# Patient Record
Sex: Male | Born: 1970 | Race: White | Hispanic: No | Marital: Married | Smoking: Former smoker
Health system: Southern US, Community
[De-identification: ages and names within clinical notes are randomized; demographics above are authoritative.]

## PROBLEM LIST (undated history)

## (undated) DIAGNOSIS — R0602 Shortness of breath: Secondary | ICD-10-CM

## (undated) DIAGNOSIS — R7303 Prediabetes: Secondary | ICD-10-CM

## (undated) HISTORY — DX: Prediabetes: R73.03

## (undated) HISTORY — DX: Shortness of breath: R06.02

---

## 2000-04-28 ENCOUNTER — Encounter: Payer: Self-pay | Admitting: Family Medicine

## 2002-05-27 ENCOUNTER — Encounter: Payer: Self-pay | Admitting: Family Medicine

## 2002-05-27 ENCOUNTER — Encounter: Admission: RE | Admit: 2002-05-27 | Discharge: 2002-05-27 | Payer: Self-pay | Admitting: Family Medicine

## 2007-11-26 ENCOUNTER — Ambulatory Visit: Payer: Self-pay | Admitting: Family Medicine

## 2007-12-08 LAB — CONVERTED CEMR LAB
ALT: 37 units/L (ref 0–53)
AST: 22 units/L (ref 0–37)
Albumin: 3.8 g/dL (ref 3.5–5.2)
Alkaline Phosphatase: 64 units/L (ref 39–117)
BUN: 10 mg/dL (ref 6–23)
Bilirubin, Direct: 0.1 mg/dL (ref 0.0–0.3)
CO2: 28 meq/L (ref 19–32)
Calcium: 9.4 mg/dL (ref 8.4–10.5)
Chloride: 107 meq/L (ref 96–112)
Cholesterol: 171 mg/dL (ref 0–200)
Creatinine, Ser: 0.9 mg/dL (ref 0.4–1.5)
GFR calc Af Amer: 122 mL/min
GFR calc non Af Amer: 101 mL/min
Glucose, Bld: 101 mg/dL — ABNORMAL HIGH (ref 70–99)
HDL: 29.4 mg/dL — ABNORMAL LOW (ref >39.0)
LDL Cholesterol: 120 mg/dL — ABNORMAL HIGH (ref 0–99)
Potassium: 4.1 meq/L (ref 3.5–5.1)
Sodium: 141 meq/L (ref 135–145)
Total Bilirubin: 0.9 mg/dL (ref 0.3–1.2)
Total CHOL/HDL Ratio: 5.8
Total Protein: 7.4 g/dL (ref 6.0–8.3)
Triglycerides: 109 mg/dL (ref 0–149)
VLDL: 22 mg/dL (ref 0–40)

## 2008-05-10 ENCOUNTER — Telehealth (INDEPENDENT_AMBULATORY_CARE_PROVIDER_SITE_OTHER): Payer: Self-pay | Admitting: *Deleted

## 2008-05-12 ENCOUNTER — Encounter (INDEPENDENT_AMBULATORY_CARE_PROVIDER_SITE_OTHER): Payer: Self-pay | Admitting: *Deleted

## 2008-11-15 ENCOUNTER — Ambulatory Visit (HOSPITAL_BASED_OUTPATIENT_CLINIC_OR_DEPARTMENT_OTHER): Admission: RE | Admit: 2008-11-15 | Discharge: 2008-11-15 | Payer: Self-pay | Admitting: Family Medicine

## 2008-11-15 ENCOUNTER — Ambulatory Visit: Payer: Self-pay | Admitting: Radiology

## 2009-11-24 ENCOUNTER — Ambulatory Visit: Payer: Self-pay | Admitting: Family Medicine

## 2009-11-24 DIAGNOSIS — R5383 Other fatigue: Secondary | ICD-10-CM

## 2009-11-24 DIAGNOSIS — F17200 Nicotine dependence, unspecified, uncomplicated: Secondary | ICD-10-CM | POA: Insufficient documentation

## 2009-11-24 DIAGNOSIS — R5381 Other malaise: Secondary | ICD-10-CM | POA: Insufficient documentation

## 2009-11-24 DIAGNOSIS — Z720 Tobacco use: Secondary | ICD-10-CM | POA: Insufficient documentation

## 2009-11-29 LAB — CONVERTED CEMR LAB
ALT: 29 units/L (ref 0–53)
AST: 19 units/L (ref 0–37)
Albumin: 4.2 g/dL (ref 3.5–5.2)
Alkaline Phosphatase: 74 units/L (ref 39–117)
BUN: 10 mg/dL (ref 6–23)
Basophils Absolute: 0.1 10*3/uL (ref 0.0–0.1)
Basophils Relative: 0.9 % (ref 0.0–3.0)
Bilirubin, Direct: 0.1 mg/dL (ref 0.0–0.3)
CO2: 27 meq/L (ref 19–32)
Calcium: 9.6 mg/dL (ref 8.4–10.5)
Chloride: 102 meq/L (ref 96–112)
Cholesterol: 191 mg/dL (ref 0–200)
Creatinine, Ser: 0.8 mg/dL (ref 0.4–1.5)
Eosinophils Absolute: 0.2 10*3/uL (ref 0.0–0.7)
Eosinophils Relative: 2.7 % (ref 0.0–5.0)
GFR calc non Af Amer: 114.22 mL/min (ref >60)
Glucose, Bld: 91 mg/dL (ref 70–99)
HCT: 51.1 % (ref 39.0–52.0)
HDL: 26 mg/dL — ABNORMAL LOW (ref >39.00)
Hemoglobin: 17.1 g/dL — ABNORMAL HIGH (ref 13.0–17.0)
LDL Cholesterol: 131 mg/dL — ABNORMAL HIGH (ref 0–99)
Lymphocytes Relative: 28.9 % (ref 12.0–46.0)
Lymphs Abs: 2.2 10*3/uL (ref 0.7–4.0)
MCHC: 33.5 g/dL (ref 30.0–36.0)
MCV: 95.2 fL (ref 78.0–100.0)
Monocytes Absolute: 0.5 10*3/uL (ref 0.1–1.0)
Monocytes Relative: 6.4 % (ref 3.0–12.0)
Neutro Abs: 4.6 10*3/uL (ref 1.4–7.7)
Neutrophils Relative %: 61.1 % (ref 43.0–77.0)
Platelets: 265 10*3/uL (ref 150.0–400.0)
Potassium: 3.9 meq/L (ref 3.5–5.1)
RBC: 5.37 M/uL (ref 4.22–5.81)
RDW: 12.8 % (ref 11.5–14.6)
Sodium: 137 meq/L (ref 135–145)
TSH: 1.55 microintl units/mL (ref 0.35–5.50)
Total Bilirubin: 1 mg/dL (ref 0.3–1.2)
Total CHOL/HDL Ratio: 7
Total Protein: 7.7 g/dL (ref 6.0–8.3)
Triglycerides: 172 mg/dL — ABNORMAL HIGH (ref 0.0–149.0)
VLDL: 34.4 mg/dL (ref 0.0–40.0)
WBC: 7.6 10*3/uL (ref 4.5–10.5)

## 2015-04-29 ENCOUNTER — Ambulatory Visit (INDEPENDENT_AMBULATORY_CARE_PROVIDER_SITE_OTHER): Payer: BLUE CROSS/BLUE SHIELD | Admitting: Internal Medicine

## 2015-04-29 ENCOUNTER — Ambulatory Visit (INDEPENDENT_AMBULATORY_CARE_PROVIDER_SITE_OTHER): Payer: BLUE CROSS/BLUE SHIELD

## 2015-04-29 VITALS — BP 100/68 | HR 103 | Temp 99.5°F | Resp 16 | Ht 70.0 in | Wt 265.0 lb

## 2015-04-29 DIAGNOSIS — R05 Cough: Secondary | ICD-10-CM | POA: Diagnosis not present

## 2015-04-29 DIAGNOSIS — R0602 Shortness of breath: Secondary | ICD-10-CM

## 2015-04-29 DIAGNOSIS — R509 Fever, unspecified: Secondary | ICD-10-CM

## 2015-04-29 DIAGNOSIS — T790XXA Air embolism (traumatic), initial encounter: Secondary | ICD-10-CM

## 2015-04-29 DIAGNOSIS — R059 Cough, unspecified: Secondary | ICD-10-CM

## 2015-04-29 DIAGNOSIS — J157 Pneumonia due to Mycoplasma pneumoniae: Secondary | ICD-10-CM

## 2015-04-29 LAB — POCT CBC
Granulocyte percent: 90.2 %G — AB (ref 37–80)
HCT, POC: 51.7 % (ref 43.5–53.7)
Hemoglobin: 16.4 g/dL (ref 14.1–18.1)
Lymph, poc: 2.2 (ref 0.6–3.4)
MCH, POC: 29.1 pg (ref 27–31.2)
MCHC: 31.8 g/dL (ref 31.8–35.4)
MCV: 91.5 fL (ref 80–97)
MID (cbc): 0.4 (ref 0–0.9)
MPV: 6.4 fL (ref 0–99.8)
POC Granulocyte: 24 — AB (ref 2–6.9)
POC LYMPH PERCENT: 8.4 %L — AB (ref 10–50)
POC MID %: 1.4 %M (ref 0–12)
Platelet Count, POC: 242 10*3/uL (ref 142–424)
RBC: 5.65 M/uL (ref 4.69–6.13)
RDW, POC: 15 %
WBC: 26.6 10*3/uL — AB (ref 4.6–10.2)

## 2015-04-29 LAB — POCT INFLUENZA A/B
Influenza A, POC: NEGATIVE
Influenza B, POC: NEGATIVE

## 2015-04-29 MED ORDER — CEFTRIAXONE SODIUM 1 G IJ SOLR
1.0000 g | Freq: Once | INTRAMUSCULAR | Status: AC
  Administered 2015-04-29: 1 g via INTRAMUSCULAR

## 2015-04-29 MED ORDER — HYDROCOD POLST-CPM POLST ER 10-8 MG/5ML PO SUER: 5.0000 mL | Freq: Two times a day (BID) | ORAL | Status: DC

## 2015-04-29 MED ORDER — AZITHROMYCIN 500 MG PO TABS: 500.0000 mg | ORAL_TABLET | Freq: Every day | ORAL | Status: DC

## 2015-04-29 NOTE — Progress Notes (Signed)
   Subjective:    Patient ID: Ricardo Clark, male    DOB: 02/02/1971, 44 y.o.   MRN: [id number]  HPI  I, [place] SImpson, CMA am scribing for Dr. Perrin MalteseGuest. Very abrupt onset of fever, head congestio, chest congestion, sweats, painful cough. Usually healthy otherwise. Quit smoking last year. Pt has been having body aches with chest pressure. No pain when breathing. Painful cough. Some nasal congestion. Pt is not currently taking any medications. Per pt his son was sick last week.   Review of Systems     Objective:   Physical Exam  Constitutional: He is oriented to person, place, and time. He appears well-nourished. He appears distressed.  HENT:  Head: Normocephalic.  Right Ear: External ear normal.  Left Ear: External ear normal.  Nose: Mucosal edema, rhinorrhea and sinus tenderness present. Epistaxis is observed. Right sinus exhibits no maxillary sinus tenderness and no frontal sinus tenderness. Left sinus exhibits no maxillary sinus tenderness and no frontal sinus tenderness.  Mouth/Throat: Oropharynx is clear and moist.  Eyes: Conjunctivae and EOM are normal. Pupils are equal, round, and reactive to light.  Neck: Normal range of motion. Neck supple.  Cardiovascular: Normal rate, regular rhythm and normal heart sounds.   Pulmonary/Chest: Effort normal. No tachypnea. No respiratory distress. He has decreased breath sounds. He has no wheezes. He has rhonchi. He has no rales. He exhibits tenderness.  Musculoskeletal: He exhibits tenderness.  Lymphadenopathy:    He has cervical adenopathy.  Neurological: He is alert and oriented to person, place, and time. He exhibits normal muscle tone. Coordination normal.  Skin: He is diaphoretic.  Psychiatric: He has a normal mood and affect. His behavior is normal.  Vitals reviewed.  Results for orders placed or performed in visit on 04/29/15  POCT CBC  Result Value Ref Range   WBC 26.6 (A) 4.6 - 10.2 K/uL   Lymph, poc 2.2 0.6 - 3.4   POC LYMPH  PERCENT 8.4 (A) 10 - 50 %L   MID (cbc) 0.4 0 - 0.9   POC MID % 1.4 0 - 12 %M   POC Granulocyte 24.0 (A) 2 - 6.9   Granulocyte percent 90.2 (A) 37 - 80 %G   RBC 5.65 4.69 - 6.13 M/uL   Hemoglobin 16.4 14.1 - 18.1 g/dL   HCT, POC 40.951.7 81.143.5 - 53.7 %   MCV 91.5 80 - 97 fL   MCH, POC 29.1 27 - 31.2 pg   MCHC 31.8 31.8 - 35.4 g/dL   RDW, POC 91.415.0 %   Platelet Count, POC 242 142 - 424 K/uL   MPV 6.4 0 - 99.8 fL  POCT Influenza A/B  Result Value Ref Range   Influenza A, POC Negative    Influenza B, POC Negative    [facility] reading (PRIMARY) by  DGuest early posterior infiltrate         Assessment & Plan:  Probable early Pneumonia Rocephin 1 g now Zithromax 500mg /Tussionex RTC tomorrow recheck

## 2015-04-29 NOTE — Patient Instructions (Addendum)
Cough, Adult  A cough is a reflex that helps clear your throat and airways. It can help heal the body or may be a reaction to an irritated airway. A cough may only last 2 or 3 weeks (acute) or may last more than 8 weeks (chronic).  CAUSES Acute cough:  Viral or bacterial infections. Chronic cough:  Infections.  Allergies.  Asthma.  Post-nasal drip.  Smoking.  Heartburn or acid reflux.  Some medicines.  Chronic lung problems (COPD).  Cancer. SYMPTOMS   Cough.  Fever.  Chest pain.  Increased breathing rate.  High-pitched whistling sound when breathing (wheezing).  Colored mucus that you cough up (sputum). TREATMENT   A bacterial cough may be treated with antibiotic medicine.  A viral cough must run its course and will not respond to antibiotics.  Your caregiver may recommend other treatments if you have a chronic cough. HOME CARE INSTRUCTIONS   Only take over-the-counter or prescription medicines for pain, discomfort, or fever as directed by your caregiver. Use cough suppressants only as directed by your caregiver.  Use a cold steam vaporizer or humidifier in your bedroom or home to help loosen secretions.  Sleep in a semi-upright position if your cough is worse at night.  Rest as needed.  Stop smoking if you smoke. SEEK IMMEDIATE MEDICAL CARE IF:   You have pus in your sputum.  Your cough starts to worsen.  You cannot control your cough with suppressants and are losing sleep.  You begin coughing up blood.  You have difficulty breathing.  You develop pain which is getting worse or is uncontrolled with medicine.  You have a fever. MAKE SURE YOU:   Understand these instructions.  Will watch your condition.  Will get help right away if you are not doing well or get worse. Document Released: 05/24/2011 Document Revised: 02/17/2012 Document Reviewed: 05/24/2011 [facility] Patient Information 2015 [place], [place]. This information is not intended  to replace advice given to you by your health care provider. Make sure you discuss any questions you have with your health care provider. Fever, Adult A fever is a higher than normal body temperature. In an adult, an oral temperature around 98.6 F (37 C) is considered normal. A temperature of 100.4 F (38 C) or higher is generally considered a fever. Mild or moderate fevers generally have no long-term effects and often do not require treatment. Extreme fever (greater than or equal to 106 F or 41.1 C) can cause seizures. The sweating that may occur with repeated or prolonged fever may cause dehydration. Elderly people can develop confusion during a fever. A measured temperature can vary with:  Age.  Time of day.  Method of measurement (mouth, underarm, rectal, or ear). The fever is confirmed by taking a temperature with a thermometer. Temperatures can be taken different ways. Some methods are accurate and some are not.  An oral temperature is used most commonly. Electronic thermometers are fast and accurate.  An ear temperature will only be accurate if the thermometer is positioned as recommended by the manufacturer.  A rectal temperature is accurate and done for those adults who have a condition where an oral temperature cannot be taken.  An underarm (axillary) temperature is not accurate and not recommended. Fever is a symptom, not a disease.  CAUSES   Infections commonly cause fever.  Some noninfectious causes for fever include:  Some arthritis conditions.  Some thyroid or adrenal gland conditions.  Some immune system conditions.  Some types of cancer.  A medicine reaction.  High doses of certain street drugs such as methamphetamine.  Dehydration.  Exposure to high outside or room temperatures.  Occasionally, the source of a fever cannot be determined. This is sometimes called a "fever of unknown origin" (FUO).  Some situations may lead to a temporary rise in body  temperature that may go away on its own. Examples are:  Childbirth.  Surgery.  Intense exercise. HOME CARE INSTRUCTIONS   Take appropriate medicines for fever. Follow dosing instructions carefully. If you use acetaminophen to reduce the fever, be careful to avoid taking other medicines that also contain acetaminophen. Do not take aspirin for a fever if you are younger than age 619. There is an association with Reye's syndrome. Reye's syndrome is a rare but potentially deadly disease.  If an infection is present and antibiotics have been prescribed, take them as directed. Finish them even if you start to feel better.  Rest as needed.  Maintain an adequate fluid intake. To prevent dehydration during an illness with prolonged or recurrent fever, you may need to drink extra fluid.Drink enough fluids to keep your urine clear or pale yellow.  Sponging or bathing with room temperature water may help reduce body temperature. Do not use ice water or alcohol sponge baths.  Dress comfortably, but do not over-bundle. SEEK MEDICAL CARE IF:   You are unable to keep fluids down.  You develop vomiting or diarrhea.  You are not feeling at least partly better after 3 days.  You develop new symptoms or problems. SEEK IMMEDIATE MEDICAL CARE IF:   You have shortness of breath or trouble breathing.  You develop excessive weakness.  You are dizzy or you faint.  You are extremely thirsty or you are making little or no urine.  You develop new pain that was not there before (such as in the head, neck, chest, back, or abdomen).  You have persistent vomiting and diarrhea for more than 1 to 2 days.  You develop a stiff neck or your eyes become sensitive to light.  You develop a skin rash.  You have a fever or persistent symptoms for more than 2 to 3 days.  You have a fever and your symptoms suddenly get worse. MAKE SURE YOU:   Understand these instructions.  Will watch your  condition.  Will get help right away if you are not doing well or get worse. Document Released: 05/21/2001 Document Revised: 04/11/2014 Document Reviewed: 09/26/2011 [facility] Patient Information 2015 [place], [place]. This information is not intended to replace advice given to you by your health care provider. Make sure you discuss any questions you have with your health care provider. Pneumonia Pneumonia is an infection of the lungs.  CAUSES Pneumonia may be caused by bacteria or a virus. Usually, these infections are caused by breathing infectious particles into the lungs (respiratory tract). SIGNS AND SYMPTOMS   Cough.  Fever.  Chest pain.  Increased rate of breathing.  Wheezing.  Mucus production. DIAGNOSIS  If you have the common symptoms of pneumonia, your health care provider will typically confirm the diagnosis with a chest X-ray. The X-ray will show an abnormality in the lung (pulmonary infiltrate) if you have pneumonia. Other tests of your blood, urine, or sputum may be done to find the specific cause of your pneumonia. Your health care provider may also do tests (blood gases or pulse oximetry) to see how well your lungs are working. TREATMENT  Some forms of pneumonia may be spread to other people  when you cough or sneeze. You may be asked to wear a mask before and during your exam. Pneumonia that is caused by bacteria is treated with antibiotic medicine. Pneumonia that is caused by the influenza virus may be treated with an antiviral medicine. Most other viral infections must run their course. These infections will not respond to antibiotics.  HOME CARE INSTRUCTIONS   Cough suppressants may be used if you are losing too much rest. However, coughing protects you by clearing your lungs. You should avoid using cough suppressants if you can.  Your health care provider may have prescribed medicine if he or she thinks your pneumonia is caused by bacteria or influenza. Finish your  medicine even if you start to feel better.  Your health care provider may also prescribe an expectorant. This loosens the mucus to be coughed up.  Take medicines only as directed by your health care provider.  Do not smoke. Smoking is a common cause of bronchitis and can contribute to pneumonia. If you are a smoker and continue to smoke, your cough may last several weeks after your pneumonia has cleared.  A cold steam vaporizer or humidifier in your room or home may help loosen mucus.  Coughing is often worse at night. Sleeping in a semi-upright position in a recliner or using a couple pillows under your head will help with this.  Get rest as you feel it is needed. Your body will usually let you know when you need to rest. PREVENTION A pneumococcal shot (vaccine) is available to prevent a common bacterial cause of pneumonia. This is usually suggested for:  People over 67 years old.  Patients on chemotherapy.  People with chronic lung problems, such as bronchitis or emphysema.  People with immune system problems. If you are over 65 or have a high risk condition, you may receive the pneumococcal vaccine if you have not received it before. In some countries, a routine influenza vaccine is also recommended. This vaccine can help prevent some cases of pneumonia.You may be offered the influenza vaccine as part of your care. If you smoke, it is time to quit. You may receive instructions on how to stop smoking. Your health care provider can provide medicines and counseling to help you quit. SEEK MEDICAL CARE IF: You have a fever. SEEK IMMEDIATE MEDICAL CARE IF:   Your illness becomes worse. This is especially true if you are elderly or weakened from any other disease.  You cannot control your cough with suppressants and are losing sleep.  You begin coughing up blood.  You develop pain which is getting worse or is uncontrolled with medicines.  Any of the symptoms which initially brought  you in for treatment are getting worse rather than better.  You develop shortness of breath or chest pain. MAKE SURE YOU:   Understand these instructions.  Will watch your condition.  Will get help right away if you are not doing well or get worse. Document Released: 11/25/2005 Document Revised: 04/11/2014 Document Reviewed: 02/14/2011 [facility] Patient Information 2015 [place], [place]. This information is not intended to replace advice given to you by your health care provider. Make sure you discuss any questions you have with your health care provider.

## 2015-04-30 ENCOUNTER — Ambulatory Visit (INDEPENDENT_AMBULATORY_CARE_PROVIDER_SITE_OTHER): Payer: BLUE CROSS/BLUE SHIELD

## 2015-04-30 ENCOUNTER — Ambulatory Visit (INDEPENDENT_AMBULATORY_CARE_PROVIDER_SITE_OTHER): Payer: BLUE CROSS/BLUE SHIELD | Admitting: Internal Medicine

## 2015-04-30 ENCOUNTER — Encounter: Payer: Self-pay | Admitting: *Deleted

## 2015-04-30 VITALS — BP 100/74 | HR 78 | Temp 98.9°F | Resp 16 | Ht 70.0 in | Wt 265.0 lb

## 2015-04-30 DIAGNOSIS — J189 Pneumonia, unspecified organism: Secondary | ICD-10-CM | POA: Diagnosis not present

## 2015-04-30 DIAGNOSIS — J0140 Acute pansinusitis, unspecified: Secondary | ICD-10-CM | POA: Diagnosis not present

## 2015-04-30 DIAGNOSIS — J0111 Acute recurrent frontal sinusitis: Secondary | ICD-10-CM

## 2015-04-30 LAB — POCT CBC
GRANULOCYTE PERCENT: 85.3 % — AB (ref 37–80)
HCT, POC: 51.4 % (ref 43.5–53.7)
HEMOGLOBIN: 16.9 g/dL (ref 14.1–18.1)
Lymph, poc: 2.7 (ref 0.6–3.4)
MCH, POC: 29.7 pg (ref 27–31.2)
MCHC: 32.9 g/dL (ref 31.8–35.4)
MCV: 90.4 fL (ref 80–97)
MID (CBC): 0.6 (ref 0–0.9)
MPV: 6.5 fL (ref 0–99.8)
POC GRANULOCYTE: 18.8 — AB (ref 2–6.9)
POC LYMPH PERCENT: 12.2 %L (ref 10–50)
POC MID %: 2.5 % (ref 0–12)
Platelet Count, POC: 244 10*3/uL (ref 142–424)
RBC: 5.68 M/uL (ref 4.69–6.13)
RDW, POC: 13.9 %
WBC: 22 10*3/uL — AB (ref 4.6–10.2)

## 2015-04-30 MED ORDER — CEFTRIAXONE SODIUM 1 G IJ SOLR
1.0000 g | Freq: Once | INTRAMUSCULAR | Status: AC
  Administered 2015-04-30: 1 g via INTRAMUSCULAR

## 2015-04-30 MED ORDER — FLUTICASONE PROPIONATE 50 MCG/ACT NA SUSP: 2.0000 | Freq: Every day | NASAL | Status: DC

## 2015-04-30 MED ORDER — METHYLPREDNISOLONE ACETATE 80 MG/ML IJ SUSP
120.0000 mg | Freq: Once | INTRAMUSCULAR | Status: AC
  Administered 2015-04-30: 120 mg via INTRAMUSCULAR

## 2015-04-30 NOTE — Progress Notes (Signed)
   Subjective:    Patient ID: Ricardo Clark, male    DOB: 02/17/1971, 44 y.o.   MRN: [id number]  HPI Has improved a little. WBC 26,000 yesterday, Fever gone. Cough is less, chest aches anterior left. CXR bronchitic changes. His sinuses are painful and full, his nose obstructed. Afrin was minimal help last night. Has chronic nasosinusitis hx, never seen ENT  Review of Systems     Objective:   Physical Exam  Constitutional: He is oriented to person, place, and time. He appears well-nourished. No distress.  HENT:  Head: Normocephalic.  Nose: Mucosal edema, rhinorrhea, sinus tenderness and nasal deformity present. No epistaxis. Right sinus exhibits maxillary sinus tenderness and frontal sinus tenderness. Left sinus exhibits maxillary sinus tenderness and frontal sinus tenderness.  Eyes: EOM are normal. No scleral icterus.  Neck: Normal range of motion.  Pulmonary/Chest: Effort normal. No tachypnea. No respiratory distress. He has decreased breath sounds. He has no wheezes. He has rhonchi. He has no rales. He exhibits tenderness.  Musculoskeletal: Normal range of motion.  Neurological: He is alert and oriented to person, place, and time. He exhibits normal muscle tone. Coordination normal.  Skin: Skin is warm and dry. No rash noted.  Psychiatric: He has a normal mood and affect.   Results for orders placed or performed in visit on 04/30/15  POCT CBC  Result Value Ref Range   WBC 22.0 (A) 4.6 - 10.2 K/uL   Lymph, poc 2.7 0.6 - 3.4   POC LYMPH PERCENT 12.2 10 - 50 %L   MID (cbc) 0.6 0 - 0.9   POC MID % 2.5 0 - 12 %M   POC Granulocyte 18.8 (A) 2 - 6.9   Granulocyte percent 85.3 (A) 37 - 80 %G   RBC 5.68 4.69 - 6.13 M/uL   Hemoglobin 16.9 14.1 - 18.1 g/dL   HCT, POC 62.151.4 30.843.5 - 53.7 %   MCV 90.4 80 - 97 fL   MCH, POC 29.7 27 - 31.2 pg   MCHC 32.9 31.8 - 35.4 g/dL   RDW, POC 65.713.9 %   Platelet Count, POC 244 142 - 424 K/uL   MPV 6.5 0 - 99.8 fL      [facility] Policy for Prescribing  Controlled Substances (Revised 10/2012) 1. Prescriptions for controlled substances will be filled by ONE provider at [facility] with whom you have established and developed a plan for your care, including follow-up. 2. You are encouraged to schedule an appointment with your prescriber at our appointment center for follow-up visits whenever possible. 3. If you request a prescription for the controlled substance while at [facility] for an acute problem (with someone other than your regular prescriber), you MAY be given a ONE-TIME prescription for a 30-day supply of the controlled substance, to allow time for you to return to see your regular prescriber for additional prescriptions [facility] reading (PRIMARY) by  Dr.Acelin Ferdig very swollen nasal turbinates, possible polyps.      Assessment & Plan:  CT scan sinuses next week Pneumonia/sinusitis/leukocytosis Rocephin 1g Depomedrol 120mg /Fluticasone nasal qd Finish zithromax/RTC Tuesday am

## 2015-04-30 NOTE — Patient Instructions (Addendum)

## 2015-05-01 ENCOUNTER — Telehealth: Payer: Self-pay

## 2015-05-01 NOTE — Telephone Encounter (Signed)
[facility] & 04/30/2015 for CAP & Sinusitis.

## 2015-05-01 NOTE — Telephone Encounter (Signed)
PLEASE NOTE: All timestamps contained within this report are represented as [place] Standard Time. CONFIDENTIALTY NOTICE: This fax transmission is intended only for the addressee. It contains information that is legally privileged, confidential or otherwise protected from use or disclosure. If you are not the intended recipient, you are strictly prohibited from reviewing, disclosing, copying using or disseminating any of this information or taking any action in reliance on or regarding this information. If you have received this fax in error, please notify us immediately by telephone so that we can arrange for its return to us. Phone: [phone], Toll-Free: [phone], Fax: [phone] Page: 1 of 2 Call Id: [id number] [facility] - Client TELEPHONE ADVICE RECORD [facility] Patient Name: Ricardo MangesDAIR CARDAJAL Gender: Male DOB: 01/13/1971 Age: [age] Y 8 M 15 D Return Phone Number: [phone] (Primary) Address: City/State/Zip: [place] Client [facility] Physician Ermalene SearingBedsole, Virg[place] Contact Type Call Call Type Triage / Clinical Relationship To Patient Self Return Phone Number [phone] (Primary) Chief Complaint BREATHING - shortness of breath or sounds breathless Initial Comment Caller states he has shortness of breath and a cough/fever PreDisposition Call Doctor Nurse Assessment Nurse: Ricardo BenesJohnson, Ricardo Clark, Ricardo Clark Date/Time Ricardo Clark(Eastern Time): 04/29/2015 10:22:43 AM Confirm and document reason for call. If symptomatic, describe symptoms. ---Susa SimmondsAdair complaining of shortness of breath, fever and cough. productive cough with red-brown mucus. onset last night also temp is 102. orally Has the patient traveled out of the country within the last 30 days? ---No Does the patient require triage? ---Yes Related visit to physician within the last 2 weeks? ---No Does the PT have any  chronic conditions? (i.e. diabetes, asthma, etc.) ---No Guidelines Guideline Title Affirmed Question Affirmed Notes Nurse Date/Time Ricardo Clark(Eastern Time) Cough - Acute Productive Wheezing is present Ricardo HandyJohnson, Ricardo Clark, Ricardo Clark 04/29/2015 10:24:34 AM Disp. Time Ricardo Clark(Eastern Time) Disposition Final User 04/29/2015 10:20:23 AM Send to Urgent Queue Ricardo Clark, Ricardo Clark 04/29/2015 10:38:56 AM See Physician within 4 Hours (or PCP triage) Yes Ricardo BenesJohnson, Ricardo Clark, Suzi RootsGail Caller Understands: Yes Disagree/Comply: Comply Care Advice Given Per Guideline SEE PHYSICIAN WITHIN 4 HOURS (or PCP triage): * You become worse. PLEASE NOTE: All timestamps contained within this report are represented as [place] Standard Time. CONFIDENTIALTY NOTICE: This fax transmission is intended only for the addressee. It contains information that is legally privileged, confidential or otherwise protected from use or disclosure. If you are not the intended recipient, you are strictly prohibited from reviewing, disclosing, copying using or disseminating any of this information or taking any action in reliance on or regarding this information. If you have received this fax in error, please notify us immediately by telephone so that we can arrange for its return to us. Phone: [phone], Toll-Free: [phone], Fax: [phone] Page: 2 of 2 Call Id: [id number] After Care Instructions Given Call Event Type User Date / Time Description Referrals  [facility]

## 2015-05-01 NOTE — Telephone Encounter (Signed)
Pt was seen 04/29/15 and 04/30/15 [facility] Dr [facility].

## 2015-05-01 NOTE — Telephone Encounter (Signed)
Agree pt needs to be seen today..  I cannot tell if appt was made.

## 2016-02-04 IMAGING — CR DG SINUSES COMPLETE 3+V
5 series · 5 of 5 positions shown · non-contrast
Comparison: None.

CLINICAL DATA: Facial pain in the region of the paranasal sinuses.

EXAM:
PARANASAL SINUSES - COMPLETE 3 + VIEW

[waters]
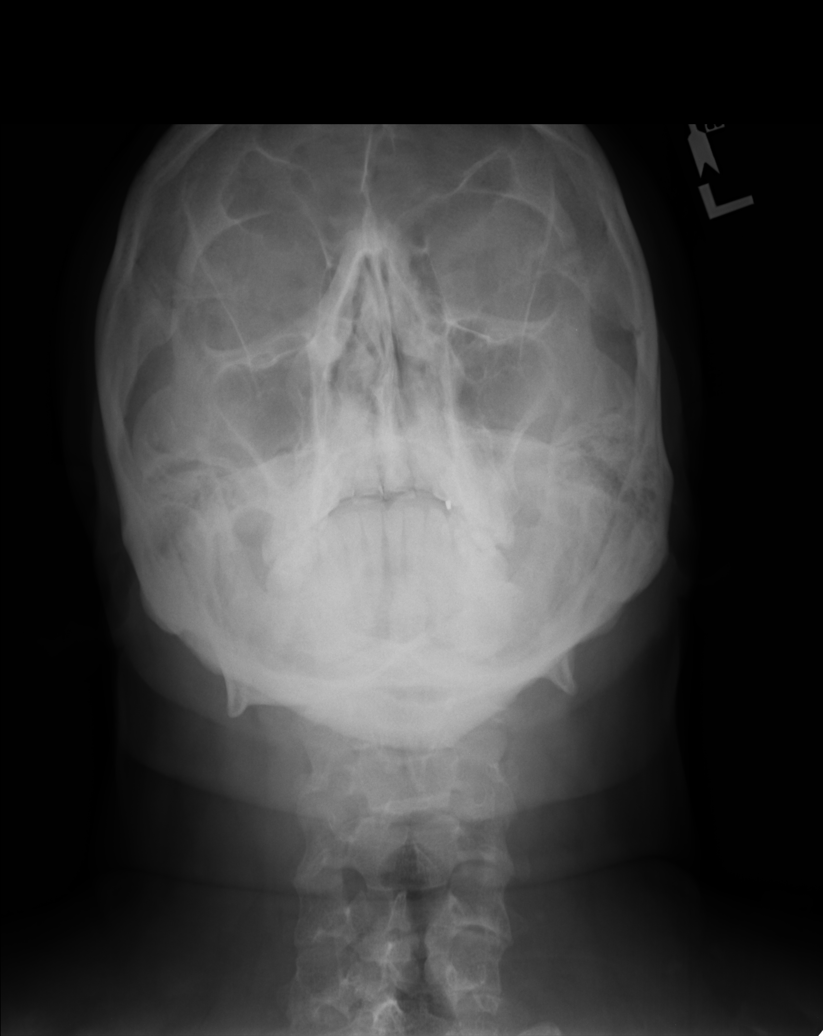

[PA]
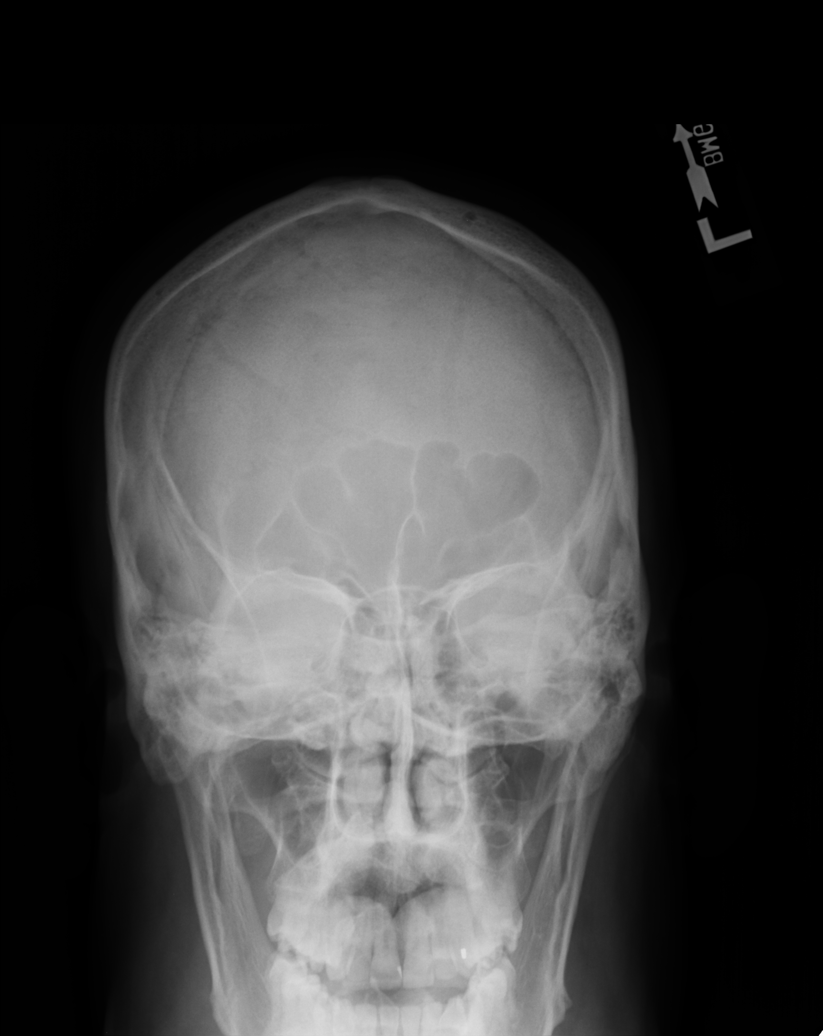

[lateral]
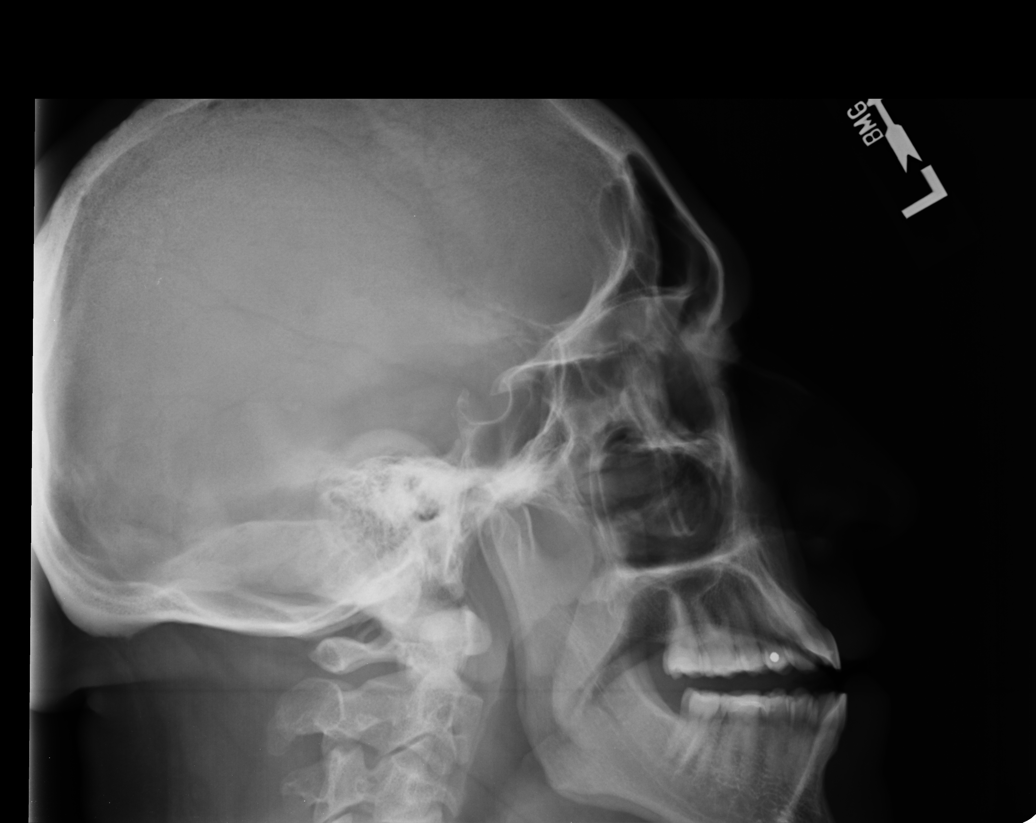

[smv (1 of 2)]
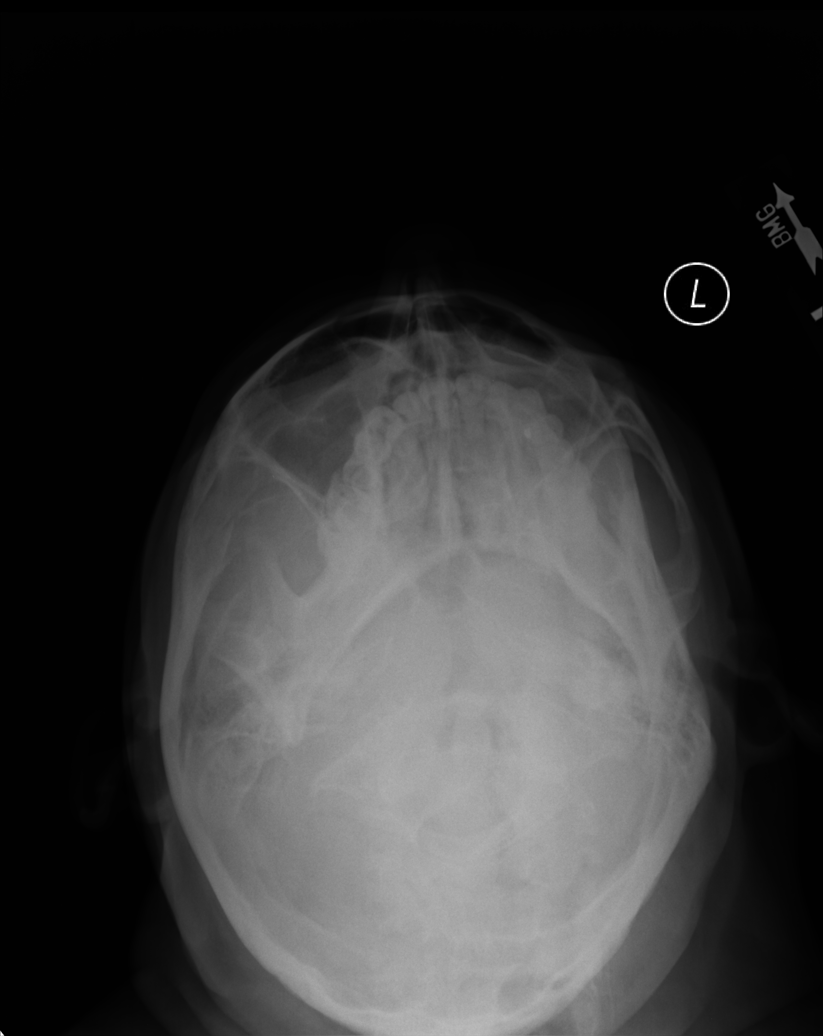

[smv (2 of 2)]
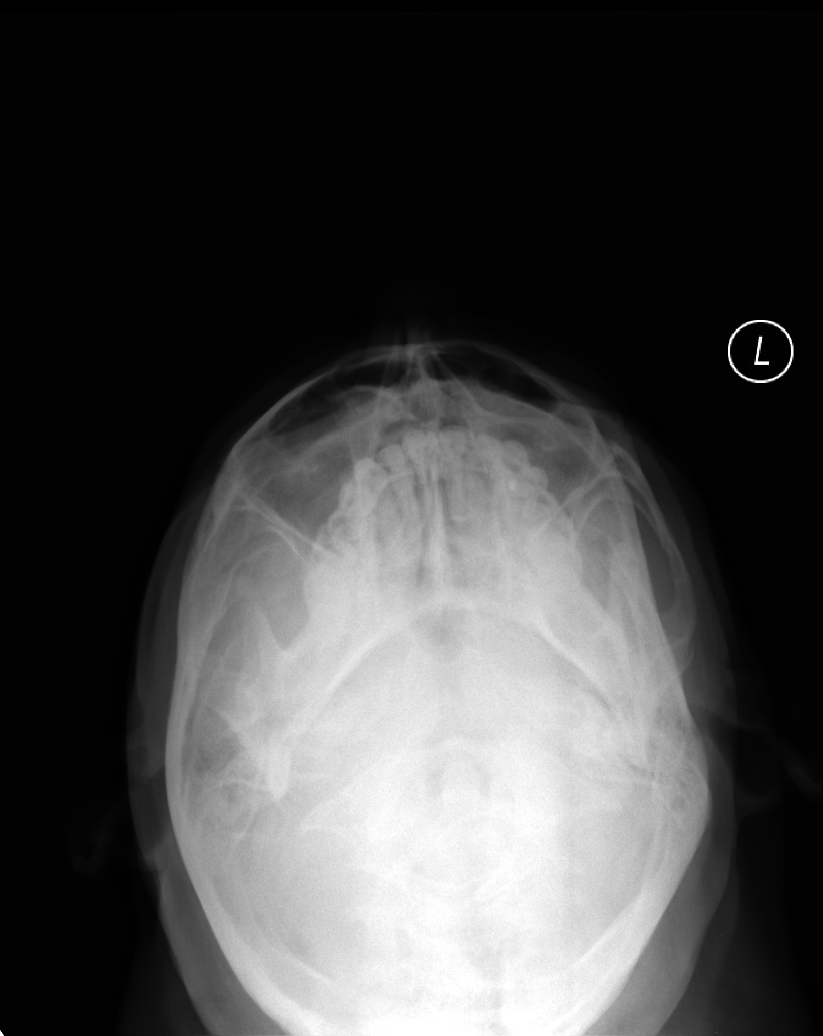

[5 of 5 positions shown; findings below may reference images not displayed]

FINDINGS: Normally pneumatized paranasal sinuses with no mucosal thickening or
air-fluid levels seen.
IMPRESSION: No evidence of sinusitis.

## 2017-02-24 DIAGNOSIS — M25562 Pain in left knee: Secondary | ICD-10-CM | POA: Diagnosis not present

## 2017-02-24 DIAGNOSIS — M1712 Unilateral primary osteoarthritis, left knee: Secondary | ICD-10-CM | POA: Diagnosis not present

## 2017-02-24 DIAGNOSIS — F17210 Nicotine dependence, cigarettes, uncomplicated: Secondary | ICD-10-CM | POA: Diagnosis not present

## 2017-04-22 DIAGNOSIS — S83242D Other tear of medial meniscus, current injury, left knee, subsequent encounter: Secondary | ICD-10-CM | POA: Diagnosis not present

## 2017-04-25 DIAGNOSIS — M25562 Pain in left knee: Secondary | ICD-10-CM | POA: Diagnosis not present

## 2017-04-25 DIAGNOSIS — G8929 Other chronic pain: Secondary | ICD-10-CM | POA: Diagnosis not present

## 2017-04-25 DIAGNOSIS — M2240 Chondromalacia patellae, unspecified knee: Secondary | ICD-10-CM | POA: Insufficient documentation

## 2017-04-25 HISTORY — DX: Chondromalacia patellae, unspecified knee: M22.40

## 2017-08-27 ENCOUNTER — Ambulatory Visit (INDEPENDENT_AMBULATORY_CARE_PROVIDER_SITE_OTHER): Payer: BLUE CROSS/BLUE SHIELD | Admitting: Primary Care

## 2017-08-27 ENCOUNTER — Encounter: Payer: Self-pay | Admitting: Primary Care

## 2017-08-27 VITALS — BP 124/82 | HR 84 | Temp 98.2°F | Ht 70.0 in | Wt 287.8 lb

## 2017-08-27 DIAGNOSIS — R109 Unspecified abdominal pain: Secondary | ICD-10-CM | POA: Diagnosis not present

## 2017-08-27 DIAGNOSIS — Z7689 Persons encountering health services in other specified circumstances: Secondary | ICD-10-CM

## 2017-08-27 LAB — POC URINALSYSI DIPSTICK (AUTOMATED)
Bilirubin, UA: NEGATIVE
Blood, UA: NEGATIVE
Glucose, UA: NEGATIVE
KETONES UA: NEGATIVE
Leukocytes, UA: NEGATIVE
Nitrite, UA: NEGATIVE
PH UA: 7.5 (ref 5.0–8.0)
PROTEIN UA: NEGATIVE
SPEC GRAV UA: 1.015 (ref 1.010–1.025)
UROBILINOGEN UA: 0.2 U/dL

## 2017-08-27 NOTE — Progress Notes (Signed)
   Subjective:    Patient ID: Ricardo Clark, male    DOB: [birth year], 46 y.o.   MRN: [id number]  HPI  Ricardo Clark is a 46 year old male who presents today to establish care and discuss the problems mentioned below. Will obtain old records.  1) Back Pain: Located to the lower right thoracic/upper lumbar back that began about 6 days ago. He was reaching over the the passenger side in his car and noticed the pain shortly after when getting out of the car. He describes his pain as sharp, pulling. His pain does radiate to the right groin. He denies numbness/tingling, weakness. He took Aleve for the first 2 days with improvement, then stopped.   He's also noticed urinary frequency over the past one month, has increase coffee consumption. Denies dysuria, hematuria, abdominal pain, nausea. History of kidney stone years ago.  Review of Systems  Constitutional: Negative for fever.  Respiratory: Negative for shortness of breath.   Cardiovascular: Negative for chest pain.  Genitourinary: Positive for frequency. Negative for dysuria and hematuria.  Musculoskeletal: Positive for back pain.  Neurological: Negative for numbness.       No past medical history on file.   Social History   Social History  . Marital status: Married    Spouse name: N/A  . Number of children: N/A  . Years of education: N/A   Occupational History  . Not on file.   Social History Main Topics  . Smoking status: Light Tobacco Smoker  . Smokeless tobacco: Current User     Comment: Vape  . Alcohol use No  . Drug use: No  . Sexual activity: Not on file   Other Topics Concern  . Not on file   Social History Narrative   Married.   1 child.   [place]   Enjoys golfing.     No past surgical history on file.  Family History  Problem Relation Age of Onset  . Hyperlipidemia Mother   . Arthritis Mother     No Known Allergies  No current outpatient prescriptions on file prior to  visit.   No current facility-administered medications on file prior to visit.     BP 124/82   Pulse 84   Temp 98.2 F (36.8 C) (Oral)   Ht  (1.778 m)   Wt 287 lb 12.8 oz (130.5 kg)   SpO2 96%   BMI 41.30 kg/m    Objective:   Physical Exam  Constitutional: He appears well-nourished.  Neck: Neck supple.  Cardiovascular: Normal rate and regular rhythm.   Pulmonary/Chest: Effort normal and breath sounds normal.  Abdominal: There is no CVA tenderness.  Musculoskeletal:       Back:  Skin: Skin is warm and dry.          Assessment & Plan:  Muscle Strain:  Right mid/lower back pain x 1 week, some improvement with Aleve. Exam today consistent for muscle strain. UA: Negative Discussed stretching, heat/ice, restart Aleve BID for the next 5-7 days. Follow up PRN.  Morrie Sheldon, NP

## 2017-08-27 NOTE — Patient Instructions (Signed)
Restart Aleve, take twice daily with food for the next 5-7 days. Continue application of heat/ice as needed.  Please schedule a physical with me at your convenience. You may also schedule a lab only appointment 3-4 days prior. We will discuss your lab results in detail during your physical.  It was a pleasure to meet you today! Please don't hesitate to call me with any questions. Welcome to [facility]!

## 2017-08-28 ENCOUNTER — Other Ambulatory Visit: Payer: Self-pay | Admitting: Primary Care

## 2017-08-28 DIAGNOSIS — Z0000 Encounter for general adult medical examination without abnormal findings: Secondary | ICD-10-CM

## 2017-09-05 ENCOUNTER — Other Ambulatory Visit (INDEPENDENT_AMBULATORY_CARE_PROVIDER_SITE_OTHER): Payer: BLUE CROSS/BLUE SHIELD

## 2017-09-05 DIAGNOSIS — Z0000 Encounter for general adult medical examination without abnormal findings: Secondary | ICD-10-CM | POA: Diagnosis not present

## 2017-09-05 LAB — LIPID PANEL
CHOL/HDL RATIO: 6
Cholesterol: 191 mg/dL (ref 0–200)
HDL: 32.8 mg/dL — ABNORMAL LOW (ref >39.00)
LDL Cholesterol: 128 mg/dL — ABNORMAL HIGH (ref 0–99)
NONHDL: 158.02
Triglycerides: 149 mg/dL (ref 0.0–149.0)
VLDL: 29.8 mg/dL (ref 0.0–40.0)

## 2017-09-05 LAB — COMPREHENSIVE METABOLIC PANEL
ALK PHOS: 67 U/L (ref 39–117)
ALT: 34 U/L (ref 0–53)
AST: 25 U/L (ref 0–37)
Albumin: 4.1 g/dL (ref 3.5–5.2)
BUN: 15 mg/dL (ref 6–23)
CO2: 28 mEq/L (ref 19–32)
Calcium: 10.1 mg/dL (ref 8.4–10.5)
Chloride: 103 mEq/L (ref 96–112)
Creatinine, Ser: 0.77 mg/dL (ref 0.40–1.50)
GFR: 115.07 mL/min (ref >60.00)
GLUCOSE: 111 mg/dL — AB (ref 70–99)
POTASSIUM: 4.2 meq/L (ref 3.5–5.1)
SODIUM: 136 meq/L (ref 135–145)
TOTAL PROTEIN: 7.4 g/dL (ref 6.0–8.3)
Total Bilirubin: 0.7 mg/dL (ref 0.2–1.2)

## 2017-09-05 LAB — HEMOGLOBIN A1C: HEMOGLOBIN A1C: 5.1 % (ref 4.6–6.5)

## 2017-09-09 ENCOUNTER — Ambulatory Visit (INDEPENDENT_AMBULATORY_CARE_PROVIDER_SITE_OTHER): Payer: BLUE CROSS/BLUE SHIELD | Admitting: Primary Care

## 2017-09-09 ENCOUNTER — Encounter: Payer: Self-pay | Admitting: Primary Care

## 2017-09-09 VITALS — BP 140/80 | HR 73 | Temp 98.7°F | Ht 70.0 in | Wt 288.1 lb

## 2017-09-09 DIAGNOSIS — Z0000 Encounter for general adult medical examination without abnormal findings: Secondary | ICD-10-CM | POA: Insufficient documentation

## 2017-09-09 DIAGNOSIS — E785 Hyperlipidemia, unspecified: Secondary | ICD-10-CM | POA: Insufficient documentation

## 2017-09-09 NOTE — Assessment & Plan Note (Signed)
TC of 191, LDL of 128. Will have him continue to work on weight loss through diet and exercise. Repeat in 1 year.

## 2017-09-09 NOTE — Patient Instructions (Signed)
Your cholesterol levels are borderline high so work on improvements in diet and regular exercise.  Continue exercising. You should be getting 150 minutes of moderate intensity exercise weekly.  Increase consumption of vegetables, fruit, whole grains, water.  Ensure you are consuming 64 ounces of water daily.  Follow up in 1 year for your annual exam or sooner if needed.  It was a pleasure to see you today!

## 2017-09-09 NOTE — Assessment & Plan Note (Signed)
Tetanus UTD, will get flu shot at work. Discussed the importance of a healthy diet and regular exercise in order for weight loss, and to reduce the risk of other medical problems. Exam unremarkable. Labs stable, discussed hyperlipidemia prevention. Follow up in 1 year.

## 2017-09-09 NOTE — Progress Notes (Signed)
Subjective:    Patient ID: Ricardo Clark, male    DOB: 29-Apr-1971, 46 y.o.   MRN: [id number]  HPI  Ricardo Clark is a 46 year old male who presents today for complete physical.  Immunizations: -Tetanus: Completed in 2010 -Influenza: Will get at work   Diet: He endorses a fair diet. Breakfast: Fruit, peanut butter and jelly sandwich Lunch: Skips sometimes, salad, chicken Dinner: Rice, vegetable, meat Snacks: Fruit, chips Desserts: Cookies, 3-4 times weekly Beverages: Water, coffee, sweet tea, some soda/juice  Exercise: He walks three days weekly for 1 hour and 30 minutes.  Eye exam: Completed 2 years ago, due next week. Dental exam: Completes semi-annually    Review of Systems  Constitutional: Negative for unexpected weight change.  HENT: Negative for rhinorrhea.   Respiratory: Negative for cough and shortness of breath.   Cardiovascular: Negative for chest pain.  Gastrointestinal: Negative for constipation and diarrhea.  Genitourinary: Negative for difficulty urinating.  Musculoskeletal: Negative for arthralgias and myalgias.  Skin: Negative for rash.  Allergic/Immunologic: Negative for environmental allergies.  Neurological: Negative for dizziness, numbness and headaches.  Psychiatric/Behavioral:       Denies concerns for anxiety and depression       No past medical history on file.   Social History   Social History  . Marital status: Married    Spouse name: N/A  . Number of children: N/A  . Years of education: N/A   Occupational History  . Not on file.   Social History Main Topics  . Smoking status: Light Tobacco Smoker  . Smokeless tobacco: Current User     Comment: Vape  . Alcohol use No  . Drug use: No  . Sexual activity: Not on file   Other Topics Concern  . Not on file   Social History Narrative   Married.   1 child.   [place]   Enjoys golfing.     No past surgical history on file.  Family History    Problem Relation Age of Onset  . Hyperlipidemia Mother   . Arthritis Mother     No Known Allergies  No current outpatient prescriptions on file prior to visit.   No current facility-administered medications on file prior to visit.     BP 140/80   Pulse 73   Temp 98.7 F (37.1 C) (Oral)   Ht  (1.778 m)   Wt 288 lb 1.9 oz (130.7 kg)   SpO2 96%   BMI 41.34 kg/m    Objective:   Physical Exam  Constitutional: He is oriented to person, place, and time. He appears well-nourished.  HENT:  Right Ear: Tympanic membrane and ear canal normal.  Left Ear: Tympanic membrane and ear canal normal.  Nose: Nose normal. Right sinus exhibits no maxillary sinus tenderness and no frontal sinus tenderness. Left sinus exhibits no maxillary sinus tenderness and no frontal sinus tenderness.  Mouth/Throat: Oropharynx is clear and moist.  Eyes: Pupils are equal, round, and reactive to light. Conjunctivae and EOM are normal.  Neck: Neck supple. Carotid bruit is not present. No thyromegaly present.  Cardiovascular: Normal rate, regular rhythm and normal heart sounds.   Pulmonary/Chest: Effort normal and breath sounds normal. He has no wheezes. He has no rales.  Abdominal: Soft. Bowel sounds are normal. There is no tenderness.  Musculoskeletal: Normal range of motion.  Neurological: He is alert and oriented to person, place, and time. He has normal reflexes. No cranial nerve deficit.  Skin: Skin is warm and dry.  Psychiatric: He has a normal mood and affect.          Assessment & Plan:

## 2017-11-04 ENCOUNTER — Telehealth: Payer: Self-pay | Admitting: Primary Care

## 2017-11-04 NOTE — Telephone Encounter (Signed)
Pt wife, Ricardo PoagJeanne , dropped off form for wellness exam completed 09/09/17. I placed in Rx tower. She is requesting a call when ready for pick up.

## 2017-11-04 NOTE — Telephone Encounter (Signed)
Form completed and placed in Chan's inbox. 

## 2017-11-04 NOTE — Telephone Encounter (Signed)
Placed form in Kate's inbox for review and complete. 

## 2017-11-05 NOTE — Telephone Encounter (Signed)
Spoken to patient and notified him that form is ready to pick up. Left in the front office.

## 2018-09-01 ENCOUNTER — Other Ambulatory Visit: Payer: Self-pay | Admitting: Primary Care

## 2018-09-01 DIAGNOSIS — Z0000 Encounter for general adult medical examination without abnormal findings: Secondary | ICD-10-CM

## 2018-09-01 DIAGNOSIS — E785 Hyperlipidemia, unspecified: Secondary | ICD-10-CM

## 2018-09-09 ENCOUNTER — Other Ambulatory Visit (INDEPENDENT_AMBULATORY_CARE_PROVIDER_SITE_OTHER): Payer: BLUE CROSS/BLUE SHIELD

## 2018-09-09 DIAGNOSIS — Z0000 Encounter for general adult medical examination without abnormal findings: Secondary | ICD-10-CM | POA: Diagnosis not present

## 2018-09-09 DIAGNOSIS — E785 Hyperlipidemia, unspecified: Secondary | ICD-10-CM

## 2018-09-09 LAB — LIPID PANEL
CHOL/HDL RATIO: 6
Cholesterol: 173 mg/dL (ref 0–200)
HDL: 29.3 mg/dL — ABNORMAL LOW (ref >39.00)
LDL CALC: 116 mg/dL — AB (ref 0–99)
NonHDL: 143.48
Triglycerides: 136 mg/dL (ref 0.0–149.0)
VLDL: 27.2 mg/dL (ref 0.0–40.0)

## 2018-09-09 LAB — COMPREHENSIVE METABOLIC PANEL
ALT: 28 U/L (ref 0–53)
AST: 21 U/L (ref 0–37)
Albumin: 4.2 g/dL (ref 3.5–5.2)
Alkaline Phosphatase: 81 U/L (ref 39–117)
BUN: 17 mg/dL (ref 6–23)
CALCIUM: 9.8 mg/dL (ref 8.4–10.5)
CHLORIDE: 105 meq/L (ref 96–112)
CO2: 28 mEq/L (ref 19–32)
CREATININE: 0.87 mg/dL (ref 0.40–1.50)
GFR: 99.51 mL/min (ref >60.00)
Glucose, Bld: 111 mg/dL — ABNORMAL HIGH (ref 70–99)
POTASSIUM: 4 meq/L (ref 3.5–5.1)
Sodium: 139 mEq/L (ref 135–145)
Total Bilirubin: 0.6 mg/dL (ref 0.2–1.2)
Total Protein: 7.9 g/dL (ref 6.0–8.3)

## 2018-09-11 ENCOUNTER — Encounter: Payer: BLUE CROSS/BLUE SHIELD | Admitting: Primary Care

## 2018-09-15 ENCOUNTER — Other Ambulatory Visit: Payer: Self-pay | Admitting: Primary Care

## 2018-09-15 ENCOUNTER — Encounter: Payer: Self-pay | Admitting: Primary Care

## 2018-09-15 ENCOUNTER — Ambulatory Visit (INDEPENDENT_AMBULATORY_CARE_PROVIDER_SITE_OTHER): Payer: BLUE CROSS/BLUE SHIELD | Admitting: Primary Care

## 2018-09-15 VITALS — BP 122/82 | HR 75 | Temp 98.0°F | Ht 70.0 in | Wt 279.5 lb

## 2018-09-15 DIAGNOSIS — E785 Hyperlipidemia, unspecified: Secondary | ICD-10-CM

## 2018-09-15 DIAGNOSIS — R7303 Prediabetes: Secondary | ICD-10-CM | POA: Insufficient documentation

## 2018-09-15 DIAGNOSIS — Z0000 Encounter for general adult medical examination without abnormal findings: Secondary | ICD-10-CM | POA: Diagnosis not present

## 2018-09-15 DIAGNOSIS — R739 Hyperglycemia, unspecified: Secondary | ICD-10-CM | POA: Diagnosis not present

## 2018-09-15 DIAGNOSIS — F17200 Nicotine dependence, unspecified, uncomplicated: Secondary | ICD-10-CM | POA: Diagnosis not present

## 2018-09-15 LAB — POCT GLYCOSYLATED HEMOGLOBIN (HGB A1C): Hemoglobin A1C: 5.7 % — AB (ref 4.0–5.6)

## 2018-09-15 NOTE — Progress Notes (Signed)
Subjective:    Patient ID: Ricardo Clark, male    DOB: 1971/09/20, 47 y.o.   MRN: [id number]  HPI  Ricardo Clark is a 47 year old male who presents today for complete physical.  Immunizations: -Tetanus: Completed in 2010 -Influenza: Declines    Diet: He endorses a healthier diet Breakfast: Yogurt, eggs Lunch: Skips Dinner: Chicken, rice, vegetables  Snacks: Chips Desserts: Twice weekly  Beverages: Water, juice, occasional soda  Exercise: He is walking three days weekly for 35 minutes  Eye exam: Due this year. Dental exam: Completes semi-annually   BP Readings from Last 3 Encounters:  09/15/18 122/82  09/09/17 140/80  08/27/17 124/82    Wt Readings from Last 3 Encounters:  09/15/18 279 lb 8 oz (126.8 kg)  09/09/17 288 lb 1.9 oz (130.7 kg)  08/27/17 287 lb 12.8 oz (130.5 kg)      Review of Systems  Constitutional: Negative for unexpected weight change.  HENT: Negative for rhinorrhea.   Respiratory: Negative for shortness of breath.        Some exertional shortness of breath during exercise  Cardiovascular: Negative for chest pain.  Gastrointestinal: Negative for constipation and diarrhea.  Genitourinary: Negative for difficulty urinating.  Musculoskeletal: Negative for arthralgias and myalgias.  Skin: Negative for rash.  Allergic/Immunologic: Negative for environmental allergies.  Neurological: Negative for dizziness, numbness and headaches.  Psychiatric/Behavioral: The patient is not nervous/anxious.        No past medical history on file.   Social History   Socioeconomic History  . Marital status: Married    Spouse name: Not on file  . Number of children: Not on file  . Years of education: Not on file  . Highest education level: Not on file  Occupational History  . Not on file  Social Needs  . Financial resource strain: Not on file  . Food insecurity:    Worry: Not on file    Inability: Not on file  . Transportation needs:    Medical: Not on  file    Non-medical: Not on file  Tobacco Use  . Smoking status: Light Tobacco Smoker  . Smokeless tobacco: Current User  . Tobacco comment: Vape  Substance and Sexual Activity  . Alcohol use: No    Alcohol/week: 0.0 standard drinks  . Drug use: No  . Sexual activity: Not on file  Lifestyle  . Physical activity:    Days per week: Not on file    Minutes per session: Not on file  . Stress: Not on file  Relationships  . Social connections:    Talks on phone: Not on file    Gets together: Not on file    Attends religious service: Not on file    Active member of club or organization: Not on file    Attends meetings of clubs or organizations: Not on file    Relationship status: Not on file  . Intimate partner violence:    Fear of current or ex partner: Not on file    Emotionally abused: Not on file    Physically abused: Not on file    Forced sexual activity: Not on file  Other Topics Concern  . Not on file  Social History Narrative   Married.   1 child.   [place] at [place]   Enjoys golfing.     No past surgical history on file.  Family History  Problem Relation Age of Onset  . Hyperlipidemia Mother   .  Arthritis Mother     No Known Allergies  No current outpatient medications on file prior to visit.   No current facility-administered medications on file prior to visit.     BP 122/82   Pulse 75   Temp 98 F (36.7 C) (Oral)   Ht 5\' 10"  (1.778 m)   Wt 279 lb 8 oz (126.8 kg)   SpO2 96%   BMI 40.10 kg/m    Objective:   Physical Exam  Constitutional: He is oriented to person, place, and time. He appears well-nourished.  HENT:  Mouth/Throat: No oropharyngeal exudate.  Eyes: Pupils are equal, round, and reactive to light. EOM are normal.  Neck: Neck supple. No thyromegaly present.  Cardiovascular: Normal rate and regular rhythm.  Respiratory: Effort normal and breath sounds normal.  GI: Soft. Bowel sounds are normal. There is no  tenderness.  Musculoskeletal: Normal range of motion.  Neurological: He is alert and oriented to person, place, and time.  Skin: Skin is warm and dry.  Psychiatric: He has a normal mood and affect.           Assessment & Plan:

## 2018-09-15 NOTE — Assessment & Plan Note (Signed)
Improved on recent labs, commended him on lifestyle changes and encouraged to continue.

## 2018-09-15 NOTE — Assessment & Plan Note (Signed)
Recent A1C of 5.7. Discussed during visit to work on diet and exercise. Will repeat in 6 months.

## 2018-09-15 NOTE — Assessment & Plan Note (Signed)
Tetanus UTD, declines influenza vaccination. Commended him on dietary changes, encouraged to continue to work on changes and increase exercise. Exam unremarkable. Labs reviewed. A1C pending. Follow up in 1 year for CPE.

## 2018-09-15 NOTE — Patient Instructions (Addendum)
Stop by the lab prior to leaving today. I will notify you of your results once received.   Continue exercising. You should be getting 150 minutes of moderate intensity exercise weekly.  Continue to work on improving your diet. Limit sodas, juice, tea.   Ensure you are consuming 64 ounces of water daily.  We will see you in one year for your annual exam or sooner if needed.  It was a pleasure to see you today!   Preventive Care 40-64 Years, Male Preventive care refers to lifestyle choices and visits with your health care provider that can promote health and wellness. What does preventive care include?  A yearly physical exam. This is also called an annual well check.  Dental exams once or twice a year.  Routine eye exams. Ask your health care provider how often you should have your eyes checked.  Personal lifestyle choices, including: ? Daily care of your teeth and gums. ? Regular physical activity. ? Eating a healthy diet. ? Avoiding tobacco and drug use. ? Limiting alcohol use. ? Practicing safe sex. ? Taking low-dose aspirin every day starting at age 31. What happens during an annual well check? The services and screenings done by your health care provider during your annual well check will depend on your age, overall health, lifestyle risk factors, and family history of disease. Counseling Your health care provider may ask you questions about your:  Alcohol use.  Tobacco use.  Drug use.  Emotional well-being.  Home and relationship well-being.  Sexual activity.  Eating habits.  Work and work Statistician.  Screening You may have the following tests or measurements:  Height, weight, and BMI.  Blood pressure.  Lipid and cholesterol levels. These may be checked every 5 years, or more frequently if you are over 26 years old.  Skin check.  Lung cancer screening. You may have this screening every year starting at age 73 if you have a 30-pack-year history of  smoking and currently smoke or have quit within the past 15 years.  Fecal occult blood test (FOBT) of the stool. You may have this test every year starting at age 13.  Flexible sigmoidoscopy or colonoscopy. You may have a sigmoidoscopy every 5 years or a colonoscopy every 10 years starting at age 52.  Prostate cancer screening. Recommendations will vary depending on your family history and other risks.  Hepatitis C blood test.  Hepatitis B blood test.  Sexually transmitted disease (STD) testing.  Diabetes screening. This is done by checking your blood sugar (glucose) after you have not eaten for a while (fasting). You may have this done every 1-3 years.  Discuss your test results, treatment options, and if necessary, the need for more tests with your health care provider. Vaccines Your health care provider may recommend certain vaccines, such as:  Influenza vaccine. This is recommended every year.  Tetanus, diphtheria, and acellular pertussis (Tdap, Td) vaccine. You may need a Td booster every 10 years.  Varicella vaccine. You may need this if you have not been vaccinated.  Zoster vaccine. You may need this after age 101.  Measles, mumps, and rubella (MMR) vaccine. You may need at least one dose of MMR if you were born in 1957 or later. You may also need a second dose.  Pneumococcal 13-valent conjugate (PCV13) vaccine. You may need this if you have certain conditions and have not been vaccinated.  Pneumococcal polysaccharide (PPSV23) vaccine. You may need one or two doses if you smoke cigarettes or  if you have certain conditions.  Meningococcal vaccine. You may need this if you have certain conditions.  Hepatitis A vaccine. You may need this if you have certain conditions or if you travel or work in places where you may be exposed to hepatitis A.  Hepatitis B vaccine. You may need this if you have certain conditions or if you travel or work in places where you may be exposed to  hepatitis B.  Haemophilus influenzae type b (Hib) vaccine. You may need this if you have certain risk factors.  Talk to your health care provider about which screenings and vaccines you need and how often you need them. This information is not intended to replace advice given to you by your health care provider. Make sure you discuss any questions you have with your health care provider. Document Released: 12/22/2015 Document Revised: 08/14/2016 Document Reviewed: 09/26/2015 Elsevier Interactive Patient Education  [employer].

## 2018-09-15 NOTE — Assessment & Plan Note (Signed)
Working on cutting back on cigarette use, encouraged him to quit.

## 2019-03-17 ENCOUNTER — Other Ambulatory Visit: Payer: BLUE CROSS/BLUE SHIELD

## 2019-03-17 ENCOUNTER — Other Ambulatory Visit: Payer: Self-pay

## 2019-09-12 ENCOUNTER — Other Ambulatory Visit: Payer: Self-pay | Admitting: Primary Care

## 2019-09-12 DIAGNOSIS — R7303 Prediabetes: Secondary | ICD-10-CM

## 2019-09-12 DIAGNOSIS — E785 Hyperlipidemia, unspecified: Secondary | ICD-10-CM

## 2019-09-17 ENCOUNTER — Other Ambulatory Visit: Payer: BLUE CROSS/BLUE SHIELD

## 2019-09-20 ENCOUNTER — Encounter: Payer: BLUE CROSS/BLUE SHIELD | Admitting: Primary Care

## 2019-10-20 ENCOUNTER — Other Ambulatory Visit: Payer: Self-pay

## 2019-10-20 ENCOUNTER — Other Ambulatory Visit (INDEPENDENT_AMBULATORY_CARE_PROVIDER_SITE_OTHER): Payer: BC Managed Care – PPO

## 2019-10-20 DIAGNOSIS — E785 Hyperlipidemia, unspecified: Secondary | ICD-10-CM | POA: Diagnosis not present

## 2019-10-20 DIAGNOSIS — R7303 Prediabetes: Secondary | ICD-10-CM | POA: Diagnosis not present

## 2019-10-20 LAB — COMPREHENSIVE METABOLIC PANEL
ALT: 34 U/L (ref 0–53)
AST: 22 U/L (ref 0–37)
Albumin: 4.2 g/dL (ref 3.5–5.2)
Alkaline Phosphatase: 83 U/L (ref 39–117)
BUN: 16 mg/dL (ref 6–23)
CO2: 28 mEq/L (ref 19–32)
Calcium: 10 mg/dL (ref 8.4–10.5)
Chloride: 107 mEq/L (ref 96–112)
Creatinine, Ser: 0.84 mg/dL (ref 0.40–1.50)
GFR: 97.05 mL/min (ref >60.00)
Glucose, Bld: 121 mg/dL — ABNORMAL HIGH (ref 70–99)
Potassium: 4.2 mEq/L (ref 3.5–5.1)
Sodium: 141 mEq/L (ref 135–145)
Total Bilirubin: 0.6 mg/dL (ref 0.2–1.2)
Total Protein: 7.4 g/dL (ref 6.0–8.3)

## 2019-10-20 LAB — LIPID PANEL
Cholesterol: 190 mg/dL (ref 0–200)
HDL: 34 mg/dL — ABNORMAL LOW (ref >39.00)
LDL Cholesterol: 124 mg/dL — ABNORMAL HIGH (ref 0–99)
NonHDL: 155.78
Total CHOL/HDL Ratio: 6
Triglycerides: 158 mg/dL — ABNORMAL HIGH (ref 0.0–149.0)
VLDL: 31.6 mg/dL (ref 0.0–40.0)

## 2019-10-20 LAB — HEMOGLOBIN A1C: Hgb A1c MFr Bld: 5.1 % (ref 4.6–6.5)

## 2019-10-27 ENCOUNTER — Ambulatory Visit (INDEPENDENT_AMBULATORY_CARE_PROVIDER_SITE_OTHER): Payer: BC Managed Care – PPO | Admitting: Primary Care

## 2019-10-27 ENCOUNTER — Other Ambulatory Visit: Payer: Self-pay

## 2019-10-27 VITALS — BP 120/82 | HR 65 | Temp 97.5°F | Ht 70.0 in | Wt 289.0 lb

## 2019-10-27 DIAGNOSIS — Z0000 Encounter for general adult medical examination without abnormal findings: Secondary | ICD-10-CM

## 2019-10-27 DIAGNOSIS — R7303 Prediabetes: Secondary | ICD-10-CM | POA: Diagnosis not present

## 2019-10-27 DIAGNOSIS — F17200 Nicotine dependence, unspecified, uncomplicated: Secondary | ICD-10-CM | POA: Diagnosis not present

## 2019-10-27 DIAGNOSIS — M25562 Pain in left knee: Secondary | ICD-10-CM

## 2019-10-27 DIAGNOSIS — Z23 Encounter for immunization: Secondary | ICD-10-CM | POA: Diagnosis not present

## 2019-10-27 DIAGNOSIS — E785 Hyperlipidemia, unspecified: Secondary | ICD-10-CM | POA: Diagnosis not present

## 2019-10-27 DIAGNOSIS — Z1211 Encounter for screening for malignant neoplasm of colon: Secondary | ICD-10-CM

## 2019-10-27 DIAGNOSIS — G8929 Other chronic pain: Secondary | ICD-10-CM

## 2019-10-27 NOTE — Assessment & Plan Note (Addendum)
Chronic, stable. He is following with orthopedics.  He received one steroid injection to the left knee last year.  Ibuprofen as needed for pain relief.   He is scheduled to follow-up with ortho in March 2021.   Encouraged exercise as tolerated.   Agree with plan, Allie Bossier, NP-C

## 2019-10-27 NOTE — Assessment & Plan Note (Addendum)
Improved. Previous A1C of 5.7, most recent A1C 5.1 on 10/20/2019.   Discussed continued need for lifestyle modifications with healthy diet and exercise.   Repeat A1C next year with physical.   Agree with plan, Pleas Koch, NP

## 2019-10-27 NOTE — Progress Notes (Signed)
Subjective:    Patient ID: Ricardo Clark, male    DOB: [birth year], 48 y.o.   MRN: [id number]  HPI  Ricardo Clark is a 48 year old male who presents today for complete physical.  Immunizations: -Tetanus: Completed in 2010, declines -Influenza: Declines -Pneumonia: Never completed  Diet: He endorses a poor diet. Eats desserts daily. Mostly home cooked meals. Drinking sweet tea, coffee, water.  Exercise: Walking twice weekly.  Eye exam: scheduled in November  Dental exam: Completes semi-annually  Colonoscopy: Never completed, declines   BP Readings from Last 3 Encounters:  10/27/19 120/82  09/15/18 122/82  09/09/17 140/80   The 10-year ASCVD risk score Mikey Bussing DC Jr., et al., 2013) is: 9%   Values used to calculate the score:     Age: 1 years     Sex: Male     Is Non-Hispanic African American: No     Diabetic: No     Tobacco smoker: Yes     Systolic Blood Pressure: 185 mmHg     Is BP treated: No     HDL Cholesterol: 34 mg/dL     Total Cholesterol: 190 mg/dL   Review of Systems  Constitutional: Negative for unexpected weight change.  HENT: Negative for rhinorrhea.   Respiratory: Negative for cough and shortness of breath.   Cardiovascular: Negative for chest pain.  Gastrointestinal: Negative for constipation and diarrhea.  Genitourinary: Negative for difficulty urinating.  Musculoskeletal: Positive for arthralgias.       Chronic left knee pain, following with orthopedics  Skin: Negative for rash.  Allergic/Immunologic: Negative for environmental allergies.  Neurological: Negative for dizziness, numbness and headaches.  Psychiatric/Behavioral: The patient is not nervous/anxious.        No past medical history on file.   Social History   Socioeconomic History  . Marital status: Married    Spouse name: Not on file  . Number of children: Not on file  . Years of education: Not on file  . Highest education level: Not on file  Occupational History  . Not on file   Social Needs  . Financial resource strain: Not on file  . Food insecurity    Worry: Not on file    Inability: Not on file  . Transportation needs    Medical: Not on file    Non-medical: Not on file  Tobacco Use  . Smoking status: Light Tobacco Smoker  . Smokeless tobacco: Current User  . Tobacco comment: Vape  Substance and Sexual Activity  . Alcohol use: No    Alcohol/week: 0.0 standard drinks  . Drug use: No  . Sexual activity: Not on file  Lifestyle  . Physical activity    Days per week: Not on file    Minutes per session: Not on file  . Stress: Not on file  Relationships  . Social Herbalist on phone: Not on file    Gets together: Not on file    Attends religious service: Not on file    Active member of club or organization: Not on file    Attends meetings of clubs or organizations: Not on file    Relationship status: Not on file  . Intimate partner violence    Fear of current or ex partner: Not on file    Emotionally abused: Not on file    Physically abused: Not on file    Forced sexual activity: Not on file  Other Topics Concern  . Not on file  Social  History Narrative   Married.   1 child.   [place] at [place]   Enjoys golfing.     No past surgical history on file.  Family History  Problem Relation Age of Onset  . Hyperlipidemia Mother   . Arthritis Mother     No Known Allergies  No current outpatient medications on file prior to visit.   No current facility-administered medications on file prior to visit.     BP 120/82   Pulse 65   Temp (!) 97.5 F (36.4 C) (Temporal)   Ht 5\' 10"  (1.778 m)   Wt 289 lb (131.1 kg)   SpO2 97%   BMI 41.47 kg/m    Objective:   Physical Exam  Constitutional: He is oriented to person, place, and time. He appears well-nourished.  HENT:  Right Ear: Tympanic membrane and ear canal normal.  Left Ear: Tympanic membrane and ear canal normal.  Mouth/Throat: Oropharynx is clear and  moist.  Eyes: Pupils are equal, round, and reactive to light. EOM are normal.  Neck: Neck supple.  Cardiovascular: Normal rate and regular rhythm.  Respiratory: Effort normal and breath sounds normal.  GI: Soft. Bowel sounds are normal. There is no abdominal tenderness.  Musculoskeletal: Normal range of motion.  Neurological: He is alert and oriented to person, place, and time.  Skin: Skin is warm and dry.  Psychiatric: He has a normal mood and affect.           Assessment & Plan:

## 2019-10-27 NOTE — Assessment & Plan Note (Addendum)
He is still smoking. Has quit twice in the past but has started again due to weight gain.   Advised smoking cessation. He is willing to try.   Discussed CVD risk factors. Encouraged exercise as tolerated.   Agree with plan, Allie Bossier, NP-C

## 2019-10-27 NOTE — Patient Instructions (Signed)
You will be contacted regarding your referral to GI for your colonoscopy.  Please let us know if you have not been contacted within two weeks.   Continue exercising. You should be getting 150 minutes of moderate intensity exercise weekly.  It's important to improve your diet by reducing consumption of fast food, fried food, processed snack foods, sugary drinks. Increase consumption of fresh vegetables and fruits, whole grains, water.  Ensure you are drinking 64 ounces of water daily.  Please schedule a lab only appointment in 6 months to recheck your lipid panel.   It was a pleasure to see you today!    Preventive Care 69-55 Years Old, Male Preventive care refers to lifestyle choices and visits with your health care provider that can promote health and wellness. This includes:  A yearly physical exam. This is also called an annual well check.  Regular dental and eye exams.  Immunizations.  Screening for certain conditions.  Healthy lifestyle choices, such as eating a healthy diet, getting regular exercise, not using drugs or products that contain nicotine and tobacco, and limiting alcohol use. What can I expect for my preventive care visit? Physical exam Your health care provider will check:  Height and weight. These may be used to calculate body mass index (BMI), which is a measurement that tells if you are at a healthy weight.  Heart rate and blood pressure.  Your skin for abnormal spots. Counseling Your health care provider may ask you questions about:  Alcohol, tobacco, and drug use.  Emotional well-being.  Home and relationship well-being.  Sexual activity.  Eating habits.  Work and work Statistician. What immunizations do I need?  Influenza (flu) vaccine  This is recommended every year. Tetanus, diphtheria, and pertussis (Tdap) vaccine  You may need a Td booster every 10 years. Varicella (chickenpox) vaccine  You may need this vaccine if you have not  already been vaccinated. Zoster (shingles) vaccine  You may need this after age 22. Measles, mumps, and rubella (MMR) vaccine  You may need at least one dose of MMR if you were born in 1957 or later. You may also need a second dose. Pneumococcal conjugate (PCV13) vaccine  You may need this if you have certain conditions and were not previously vaccinated. Pneumococcal polysaccharide (PPSV23) vaccine  You may need one or two doses if you smoke cigarettes or if you have certain conditions. Meningococcal conjugate (MenACWY) vaccine  You may need this if you have certain conditions. Hepatitis A vaccine  You may need this if you have certain conditions or if you travel or work in places where you may be exposed to hepatitis A. Hepatitis B vaccine  You may need this if you have certain conditions or if you travel or work in places where you may be exposed to hepatitis B. Haemophilus influenzae type b (Hib) vaccine  You may need this if you have certain risk factors. Human papillomavirus (HPV) vaccine  If recommended by your health care provider, you may need three doses over 6 months. You may receive vaccines as individual doses or as more than one vaccine together in one shot (combination vaccines). Talk with your health care provider about the risks and benefits of combination vaccines. What tests do I need? Blood tests  Lipid and cholesterol levels. These may be checked every 5 years, or more frequently if you are over 49 years old.  Hepatitis C test.  Hepatitis B test. Screening  Lung cancer screening. You may have this screening  every year starting at age 43 if you have a 30-pack-year history of smoking and currently smoke or have quit within the past 15 years.  Prostate cancer screening. Recommendations will vary depending on your family history and other risks.  Colorectal cancer screening. All adults should have this screening starting at age 34 and continuing until age  40. Your health care provider may recommend screening at age 44 if you are at increased risk. You will have tests every 1-10 years, depending on your results and the type of screening test.  Diabetes screening. This is done by checking your blood sugar (glucose) after you have not eaten for a while (fasting). You may have this done every 1-3 years.  Sexually transmitted disease (STD) testing. Follow these instructions at home: Eating and drinking  Eat a diet that includes fresh fruits and vegetables, whole grains, lean protein, and low-fat dairy products.  Take vitamin and mineral supplements as recommended by your health care provider.  Do not drink alcohol if your health care provider tells you not to drink.  If you drink alcohol: ? Limit how much you have to 0-2 drinks a day. ? Be aware of how much alcohol is in your drink. In the [place] one drink equals one 12 oz bottle of beer (355 mL), one 5 oz glass of wine (148 mL), or one 1 oz glass of hard liquor (44 mL). Lifestyle  Take daily care of your teeth and gums.  Stay active. Exercise for at least 30 minutes on 5 or more days each week.  Do not use any products that contain nicotine or tobacco, such as cigarettes, e-cigarettes, and chewing tobacco. If you need help quitting, ask your health care provider.  If you are sexually active, practice safe sex. Use a condom or other form of protection to prevent STIs (sexually transmitted infections).  Talk with your health care provider about taking a low-dose aspirin every day starting at age 73. What's next?  Go to your health care provider once a year for a well check visit.  Ask your health care provider how often you should have your eyes and teeth checked.  Stay up to date on all vaccines. This information is not intended to replace advice given to you by your health care provider. Make sure you discuss any questions you have with your health care provider. Document Released:  12/22/2015 Document Revised: 11/19/2018 Document Reviewed: 11/19/2018 Elsevier Patient Education  2020 [employer].

## 2019-10-27 NOTE — Assessment & Plan Note (Addendum)
Think his Tetanus is UTD, he will clarify. Declines influzena, pneumovax given today.   Referral placed to GI for colonoscopy. Exam unremarkable. Labs reviewed.  Encouraged him to continue to work on dietary changes and to increase exercise.   Repeat lipid panel in 6 months.   Follow-up in one year for CPE.   Agree with plan, Allie Bossier, NP-C

## 2019-10-27 NOTE — Assessment & Plan Note (Addendum)
Remains borderline. Most recent LDL 124.   ASCVD risk score 9% today.   Discussed lifestyle modifications such as healthy diet and regular exercise.   Repeat lipid panel in 6 months.   Agree with plan, Allie Bossier, NP-C

## 2019-10-27 NOTE — Progress Notes (Signed)
   Subjective:    Patient ID: Ricardo Clark, male    DOB: Jan 03, 1971, 48 y.o.   MRN: [id number]  HPI  Ricardo Clark is a 48 year old male with a history of prediabetes, borderline hyperlipidemia and tobacco use who presents today for a complete physical exam.   Immunizations: Tetanus: Completed 2010, due 2020 Influenza: Declines Pneumonia: Declines  Diet: He endorses a fair diet, one meal a day in the evening, snack during the day, mostly home cooked meals.  Desserts daily.  Drinking mostly sweet tea, coffee and water.  Exercise: Walking twice a week for 30 minutes at a time.  Eye Exam: Due, scheduled for November 2020.  Dental Exam: Completes semi-annually Colonoscopy: Declines, will complete in 2021.    BP Readings from Last 3 Encounters:  10/27/19 120/82  09/15/18 122/82  09/09/17 140/80   The 10-year ASCVD risk score Mikey Bussing DC Jr., et al., 2013) is: 9%   Values used to calculate the score:     Age: 6 years     Sex: Male     Is Non-Hispanic African American: No     Diabetic: No     Tobacco smoker: Yes     Systolic Blood Pressure: 237 mmHg     Is BP treated: No     HDL Cholesterol: 34 mg/dL     Total Cholesterol: 190 mg/dL  Review of Systems  Constitutional: Negative for unexpected weight change.  HENT: Negative for rhinorrhea.   Respiratory: Negative for cough and shortness of breath.   Cardiovascular: Negative for chest pain.  Gastrointestinal: Negative for constipation and diarrhea.  Genitourinary: Negative for difficulty urinating.  Musculoskeletal: Positive for arthralgias (Chronic L knee pain). Negative for myalgias.  Skin: Negative for rash.  Allergic/Immunologic: Negative for environmental allergies.  Neurological: Negative for dizziness, numbness and headaches.  Psychiatric/Behavioral: The patient is not nervous/anxious.        Objective:   Physical Exam HENT:     Right Ear: Tympanic membrane and ear canal normal.     Left Ear: Tympanic membrane and  ear canal normal.     Nose: Nose normal.     Mouth/Throat:     Mouth: Mucous membranes are moist.     Pharynx: Oropharynx is clear.  Eyes:     Extraocular Movements: Extraocular movements intact.     Conjunctiva/sclera: Conjunctivae normal.     Pupils: Pupils are equal, round, and reactive to light.  Neck:     Musculoskeletal: Normal range of motion and neck supple.  Cardiovascular:     Rate and Rhythm: Normal rate and regular rhythm.     Heart sounds: Normal heart sounds.  Pulmonary:     Breath sounds: Normal breath sounds.  Abdominal:     General: Bowel sounds are normal.     Palpations: Abdomen is soft.     Tenderness: There is no abdominal tenderness.  Musculoskeletal: Normal range of motion.  Skin:    General: Skin is warm and dry.  Neurological:     Mental Status: He is alert and oriented to person, place, and time.     Cranial Nerves: No cranial nerve deficit.     Deep Tendon Reflexes: Reflexes normal.  Psychiatric:        Mood and Affect: Mood normal.           Assessment & Plan:

## 2019-10-28 NOTE — Addendum Note (Signed)
Addended by: Jacqualin Combes on: 10/28/2019 01:11 PM   Modules accepted: Orders

## 2020-01-13 DIAGNOSIS — Z20828 Contact with and (suspected) exposure to other viral communicable diseases: Secondary | ICD-10-CM | POA: Diagnosis not present

## 2020-01-17 DIAGNOSIS — Z20828 Contact with and (suspected) exposure to other viral communicable diseases: Secondary | ICD-10-CM | POA: Diagnosis not present

## 2020-02-07 ENCOUNTER — Encounter: Payer: Self-pay | Admitting: Primary Care

## 2020-02-09 ENCOUNTER — Encounter: Payer: Self-pay | Admitting: Internal Medicine

## 2020-02-23 DIAGNOSIS — J0100 Acute maxillary sinusitis, unspecified: Secondary | ICD-10-CM | POA: Diagnosis not present

## 2020-02-24 DIAGNOSIS — Z20828 Contact with and (suspected) exposure to other viral communicable diseases: Secondary | ICD-10-CM | POA: Diagnosis not present

## 2020-04-13 ENCOUNTER — Other Ambulatory Visit: Payer: Self-pay | Admitting: Primary Care

## 2020-04-13 DIAGNOSIS — E785 Hyperlipidemia, unspecified: Secondary | ICD-10-CM

## 2020-04-26 ENCOUNTER — Other Ambulatory Visit (INDEPENDENT_AMBULATORY_CARE_PROVIDER_SITE_OTHER): Payer: BC Managed Care – PPO

## 2020-04-26 ENCOUNTER — Other Ambulatory Visit: Payer: Self-pay

## 2020-04-26 DIAGNOSIS — E785 Hyperlipidemia, unspecified: Secondary | ICD-10-CM | POA: Diagnosis not present

## 2020-04-26 LAB — LIPID PANEL
Cholesterol: 177 mg/dL (ref 0–200)
HDL: 35.1 mg/dL — ABNORMAL LOW (ref >39.00)
LDL Cholesterol: 114 mg/dL — ABNORMAL HIGH (ref 0–99)
NonHDL: 141.97
Total CHOL/HDL Ratio: 5
Triglycerides: 139 mg/dL (ref 0.0–149.0)
VLDL: 27.8 mg/dL (ref 0.0–40.0)

## 2020-11-13 DIAGNOSIS — W19XXXA Unspecified fall, initial encounter: Secondary | ICD-10-CM | POA: Diagnosis not present

## 2020-11-13 DIAGNOSIS — T1490XA Injury, unspecified, initial encounter: Secondary | ICD-10-CM | POA: Diagnosis not present

## 2020-11-13 DIAGNOSIS — R030 Elevated blood-pressure reading, without diagnosis of hypertension: Secondary | ICD-10-CM | POA: Diagnosis not present

## 2020-11-13 DIAGNOSIS — M25531 Pain in right wrist: Secondary | ICD-10-CM | POA: Diagnosis not present

## 2020-11-15 DIAGNOSIS — M25531 Pain in right wrist: Secondary | ICD-10-CM | POA: Diagnosis not present

## 2020-11-15 DIAGNOSIS — W19XXXA Unspecified fall, initial encounter: Secondary | ICD-10-CM | POA: Diagnosis not present

## 2022-11-20 ENCOUNTER — Ambulatory Visit (INDEPENDENT_AMBULATORY_CARE_PROVIDER_SITE_OTHER): Payer: BC Managed Care – PPO

## 2022-11-20 ENCOUNTER — Ambulatory Visit (INDEPENDENT_AMBULATORY_CARE_PROVIDER_SITE_OTHER): Payer: BC Managed Care – PPO | Admitting: Orthopaedic Surgery

## 2022-11-20 ENCOUNTER — Encounter: Payer: Self-pay | Admitting: Orthopaedic Surgery

## 2022-11-20 VITALS — BP 127/86 | HR 74 | Ht 71.0 in | Wt 300.4 lb

## 2022-11-20 DIAGNOSIS — M25562 Pain in left knee: Secondary | ICD-10-CM

## 2022-11-20 DIAGNOSIS — G8929 Other chronic pain: Secondary | ICD-10-CM | POA: Diagnosis not present

## 2022-11-20 DIAGNOSIS — M1612 Unilateral primary osteoarthritis, left hip: Secondary | ICD-10-CM

## 2022-11-20 DIAGNOSIS — M25552 Pain in left hip: Secondary | ICD-10-CM

## 2022-11-20 DIAGNOSIS — Z6841 Body Mass Index (BMI) 40.0 and over, adult: Secondary | ICD-10-CM | POA: Diagnosis not present

## 2022-11-20 MED ORDER — MELOXICAM 7.5 MG PO TABS: 7.5000 mg | ORAL_TABLET | Freq: Every day | ORAL | 2 refills | Status: DC

## 2022-11-20 NOTE — Progress Notes (Signed)
Office Visit Note   Patient: Ricardo Clark           Date of Birth: 03-02-1971           MRN: [id number] Visit Date: 11/20/2022              Requested by: Doreene Nest, NP [address] [place],  [place] [address] PCP: Doreene Nest, NP   Assessment & Plan: Visit Diagnoses:  1. Pain of left hip   2. Chronic pain of left knee   3. Unilateral primary osteoarthritis, left hip   4. Body mass index 40.0-44.9, adult (HCC)     Plan: The patient meets the AMA guidelines for Morbid (severe) obesity with a BMI > 40.0 and I have recommended weight loss. We reviewed his x-rays he has primary hip osteoarthritis.  He states he actually is up to 96 only needs to lose 10 pounds to get his BMI below 40.  Recheck 8 weeks.  Goal weight is 286 pounds.  We discussed direct anterior approach for total of arthroplasty.  Patient works at [employer] with the Doctor, general practice and modifications.  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  Procedure discussed.  Recheck 8 weeks.  We will weigh him on return.   Follow-Up Instructions: Return in about 8 weeks (around 01/15/2023).   Orders:  Orders Placed This Encounter  Procedures   XR KNEE 3 VIEW LEFT   XR HIP UNILAT W OR W/O PELVIS 2-3 VIEWS LEFT   Meds ordered this encounter  Medications   meloxicam (MOBIC) 7.5 MG tablet    Sig: Take 1 tablet (7.5 mg total) by mouth daily.    Dispense:  30 tablet    Refill:  2      Procedures: No procedures performed   Clinical Data: No additional findings.   Subjective: Chief Complaint  Patient presents with   Left Knee - Pain    HPI 51 year old male seen with left knee pain.  He likes playing golf he had increased pain since July taken ibuprofen with some relief he has popping sometimes and sometimes when he stands up he has trouble walking he notices at the end of the day walks with a limp.  He had an injection in his knee 4 years ago but states it did not help.  He has not been through any  therapy is used heat and ibuprofen.  Review of Systems positive for smoking.  Diabetes BMI greater than 40 tobacco abuse.  No fever or chills.  No pain in right lower extremity.   Objective: Vital Signs: BP 127/86   Pulse 74   Ht 5\' 11"  (1.803 m)   Wt (!) 300 lb 6.4 oz (136.3 kg)   BMI 41.90 kg/m   Physical Exam Constitutional:      Appearance: He is well-developed.  HENT:     Head: Normocephalic and atraumatic.     Right Ear: External ear normal.     Left Ear: External ear normal.  Eyes:     Pupils: Pupils are equal, round, and reactive to light.  Neck:     Thyroid: No thyromegaly.     Trachea: No tracheal deviation.  Cardiovascular:     Rate and Rhythm: Normal rate.  Pulmonary:     Effort: Pulmonary effort is normal.     Breath sounds: No wheezing.  Abdominal:     General: Bowel sounds are normal.     Palpations: Abdomen  is soft.  Musculoskeletal:     Cervical back: Neck supple.  Skin:    General: Skin is warm and dry.     Capillary Refill: Capillary refill takes less than 2 seconds.  Neurological:     Mental Status: He is alert and oriented to person, place, and time.  Psychiatric:        Behavior: Behavior normal.        Thought Content: Thought content normal.        Judgment: Judgment normal.     Ortho Exam patient cannot figure 4 on the left he can on the right.  0 degrees internal rotation with reproduction of his distal thigh and knee pain.  External rotation only 20 degrees.  Opposite right hip has 30 degrees internal and external rotation no hip flexion contracture right or left knee reaches full extension he does have some crepitus with knee extension more on the left than right.  Distal pulses intact.  Specialty Comments:  No specialty comments available.  Imaging: XR KNEE 3 VIEW LEFT  Result Date: 11/20/2022 Standing AP both knees lateral left knee sunrise patella x-ray demonstrates minimal knee joint line narrowing no knee effusion bone anatomy  is normal. Impression: Unremarkable left knee radiographs.  XR HIP UNILAT W OR W/O PELVIS 2-3 VIEWS LEFT  Result Date: 11/20/2022 AP pelvis frog-leg left hip demonstrates left hip unilateral osteoarthritis with no joint space erosion of the head and acetabulum marginal osteophytes and subchondral sclerosis.  Opposite right hip appears normal. Impression: Left hip moderate hip osteoarthritis with erosive changes.    PMFS History: Patient Active Problem List   Diagnosis Date Noted   Unilateral primary osteoarthritis, left hip 11/20/2022   Chronic pain of left knee 10/27/2019   Prediabetes 09/15/2018   Preventative health care 09/09/2017   Borderline hyperlipidemia 09/09/2017   TOBACCO ABUSE 11/24/2009   FATIGUE 11/24/2009   History reviewed. No pertinent past medical history.  Family History  Problem Relation Age of Onset   Hyperlipidemia Mother    Arthritis Mother     History reviewed. No pertinent surgical history. Social History   Occupational History   Not on file  Tobacco Use   Smoking status: Light Smoker   Smokeless tobacco: Current   Tobacco comments:    Vape  Substance and Sexual Activity   Alcohol use: No    Alcohol/week: 0.0 standard drinks of alcohol   Drug use: No   Sexual activity: Not on file

## 2023-01-15 ENCOUNTER — Encounter: Payer: Self-pay | Admitting: Orthopaedic Surgery

## 2023-01-15 ENCOUNTER — Ambulatory Visit (INDEPENDENT_AMBULATORY_CARE_PROVIDER_SITE_OTHER): Payer: BC Managed Care – PPO | Admitting: Orthopaedic Surgery

## 2023-01-15 VITALS — BP 118/84 | HR 71 | Ht 71.0 in | Wt 299.6 lb

## 2023-01-15 DIAGNOSIS — M1612 Unilateral primary osteoarthritis, left hip: Secondary | ICD-10-CM

## 2023-01-15 NOTE — Addendum Note (Signed)
Addended by: Meyer Cory on: 01/15/2023 09:35 AM   Modules accepted: Orders

## 2023-01-15 NOTE — Progress Notes (Addendum)
Office Visit Note   Patient: Ricardo Clark           Date of Birth: 27-Jul-1971           MRN: [id number] Visit Date: 01/15/2023              Requested by: Pleas Koch, NP [place],  [place] [address] PCP: Pleas Koch, NP   Assessment & Plan: Visit Diagnoses:  1. Unilateral primary osteoarthritis, left hip     Plan: Patient has unilateral hip degenerative changes cystic changes in the head question AVN with involvement of the weightbearing surface.  Will proceed with the MRI left hip.  He will continue to work on weight loss goal weight is less than 287 current weight is 229.  Office follow-up after MRI scan.  Follow-Up Instructions: No follow-ups on file.   Orders:  No orders of the defined types were placed in this encounter.  No orders of the defined types were placed in this encounter.     Procedures: No procedures performed   Clinical Data: No additional findings.   Subjective: Chief Complaint  Patient presents with   Left Hip - Pain, Follow-up   Left Knee - Pain, Follow-up    HPI 52 year old male returns 8-week follow-up with continued problems with left hip pain.  He has been amatory with a limp x-rays that show no severe osteoarthritis.  Patient been trying to lose weight he found that he can do the elliptical and the bike.  Problems with the treadmill.  He has used meloxicam without relief.  Previous MRI of his knee was normal and a lot of his pain is distal thigh above the knee and may be referred pain from his severe hip.  Opposite right hip is normal on x-ray asymptomatic and has good range of motion.  Patient likes to play golf states he has severe problems after golf problems walking the following day with left groin pain and thigh pain on the left.  Review of Systems no history of gout.  No chills or fever.  All systems noncontributory HPI.   Objective: Vital Signs: BP 118/84   Pulse 71   Ht '5\' 11"'$  (1.803 m)   Wt 299 lb 9.6  oz (135.9 kg)   BMI 41.79 kg/m   Physical Exam Constitutional:      Appearance: He is well-developed.  HENT:     Head: Normocephalic and atraumatic.     Right Ear: External ear normal.     Left Ear: External ear normal.  Eyes:     Pupils: Pupils are equal, round, and reactive to light.  Neck:     Thyroid: No thyromegaly.     Trachea: No tracheal deviation.  Cardiovascular:     Rate and Rhythm: Normal rate.  Pulmonary:     Effort: Pulmonary effort is normal.     Breath sounds: No wheezing.  Abdominal:     General: Bowel sounds are normal.     Palpations: Abdomen is soft.  Musculoskeletal:     Cervical back: Neck supple.  Skin:    General: Skin is warm and dry.     Capillary Refill: Capillary refill takes less than 2 seconds.  Neurological:     Mental Status: He is alert and oriented to person, place, and time.  Psychiatric:        Behavior: Behavior normal.        Thought Content: Thought content normal.  Judgment: Judgment normal.     Ortho Exam positive Trendelenburg gait on the left.  Internal rotation 5 degrees with left groin pain.  Mild trochanteric bursal tenderness negative popliteal compression test.  No knee effusion.  No crepitus with knee range of motion collateral ligaments of the knee ACL PCL exam is normal.  Specialty Comments:  No specialty comments available.  Imaging: No results found.   PMFS History: Patient Active Problem List   Diagnosis Date Noted   Unilateral primary osteoarthritis, left hip 11/20/2022   Chronic pain of left knee 10/27/2019   Prediabetes 09/15/2018   Preventative health care 09/09/2017   Borderline hyperlipidemia 09/09/2017   TOBACCO ABUSE 11/24/2009   FATIGUE 11/24/2009   No past medical history on file.  Family History  Problem Relation Age of Onset   Hyperlipidemia Mother    Arthritis Mother     No past surgical history on file. Social History   Occupational History   Not on file  Tobacco Use    Smoking status: Light Smoker   Smokeless tobacco: Current   Tobacco comments:    Vape  Substance and Sexual Activity   Alcohol use: No    Alcohol/week: 0.0 standard drinks of alcohol   Drug use: No   Sexual activity: Not on file

## 2023-02-03 ENCOUNTER — Ambulatory Visit
Admission: RE | Admit: 2023-02-03 | Discharge: 2023-02-03 | Disposition: A | Discharge status: Home / Self Care | Payer: BC Managed Care – PPO | Source: Ambulatory Visit | Attending: Orthopaedic Surgery | Admitting: Orthopaedic Surgery

## 2023-02-03 DIAGNOSIS — M25552 Pain in left hip: Secondary | ICD-10-CM | POA: Diagnosis not present

## 2023-02-03 DIAGNOSIS — M1612 Unilateral primary osteoarthritis, left hip: Secondary | ICD-10-CM

## 2023-02-11 ENCOUNTER — Encounter: Payer: Self-pay | Admitting: Orthopaedic Surgery

## 2023-02-11 ENCOUNTER — Ambulatory Visit (INDEPENDENT_AMBULATORY_CARE_PROVIDER_SITE_OTHER): Payer: BC Managed Care – PPO | Admitting: Orthopaedic Surgery

## 2023-02-11 VITALS — BP 124/87 | HR 74 | Ht 71.0 in | Wt 295.4 lb

## 2023-02-11 DIAGNOSIS — M1612 Unilateral primary osteoarthritis, left hip: Secondary | ICD-10-CM

## 2023-02-11 NOTE — Progress Notes (Signed)
Office Visit Note   Patient: Ricardo Clark           Date of Birth: 1970/12/19           MRN: [id number] Visit Date: 02/11/2023              Requested by: Pleas Koch, NP [place],  [place] [id number] PCP: Pleas Koch, NP   Assessment & Plan: Visit Diagnoses:  1. Unilateral primary osteoarthritis, left hip     Plan: Severe left hip osteoarthritis with head fragmentation.  Patient continue to work on some weight loss I will recheck him in 3 months.  Once he reaches his goal weight he can notify us and come in sooner.  We discussed total of arthroplasty reviewed MRI scan again with copy of the report.  Pathophysiology discussed.  Total of arthroplasty discussed including spinal anesthesia therapy and risks of surgery.  Follow-Up Instructions: Return in about 3 months (around 05/14/2023).   Orders:  No orders of the defined types were placed in this encounter.  No orders of the defined types were placed in this encounter.     Procedures: No procedures performed   Clinical Data: No additional findings.   Subjective: Chief Complaint  Patient presents with   Left Hip - Pain, Follow-up    MRI review    HPI 52 year old male with for progressive left hip osteoarthritis.  MRI scan 02/04/2023 shows severe hip degeneration with fragmentation of the head prominent spurring and changes on the acetabular side.  He has marrow edema and changes in the head have similar appearance to avascular necrosis with involvement of the weightbearing portion and subchondral edema flattening of the head.  He is limping but still been able to play golf.  He uses the golf club as a cane.  Opposite right hip showed some mild chondral thinning on the MRI scan.  Patient does have some hyperlipidemia prediabetes tobacco abuse and BMI 41.  Review of Systems all systems noncontributory to HPI.   Objective: Vital Signs: BP 124/87   Pulse 74   Ht '5\' 11"'$  (1.803 m)   Wt 295 lb 6.4  oz (134 kg)   BMI 41.20 kg/m   Physical Exam Constitutional:      Appearance: He is well-developed.  HENT:     Head: Normocephalic and atraumatic.     Right Ear: External ear normal.     Left Ear: External ear normal.  Eyes:     Pupils: Pupils are equal, round, and reactive to light.  Neck:     Thyroid: No thyromegaly.     Trachea: No tracheal deviation.  Cardiovascular:     Rate and Rhythm: Normal rate.  Pulmonary:     Effort: Pulmonary effort is normal.     Breath sounds: No wheezing.  Abdominal:     General: Bowel sounds are normal.     Palpations: Abdomen is soft.  Musculoskeletal:     Cervical back: Neck supple.  Skin:    General: Skin is warm and dry.     Capillary Refill: Capillary refill takes less than 2 seconds.  Neurological:     Mental Status: He is alert and oriented to person, place, and time.  Psychiatric:        Behavior: Behavior normal.        Thought Content: Thought content normal.        Judgment: Judgment normal.     Ortho Exam positive Trendelenburg gait pain  with internal rotation left hip reproducing his pain in the groin radiates down to mid thigh.  Specialty Comments:  No specialty comments available.  Imaging:CLINICAL DATA:  Chronic left hip pain   EXAM: MR OF THE LEFT HIP WITHOUT CONTRAST   TECHNIQUE: Multiplanar, multisequence MR imaging was performed. No intravenous contrast was administered.   COMPARISON:  Radiographs 11/20/2022   FINDINGS: Technical note: Fat saturation failed on the axial T2 series # 11.   Bones: Severe left hip arthropathy with mild flattening of the left upper femoral head, confluent subcortical marrow edema along the left femoral head acetabulum, and suspected non-fragmented osteochondral lesions of the left femoral head and adjacent acetabulum. Prominent asymmetric spurring of the left femoral head and acetabulum.   Type 2 degenerative endplate findings and loss of intervertebral disc height disc  desiccation at L5-S1.   Mild to moderate degenerative hip arthropathy on the right with spurring and small degenerative subcortical foci along the right acetabular roof.   Articular cartilage and labrum   Articular cartilage: Severe full-thickness loss of articular cartilage in the left hip especially craniocaudad. Mild chondral thinning in the right hip.   Labrum: Degenerated left acetabular labrum without a well-defined tear observed.   Joint or bursal effusion   Joint effusion:  Small left hip joint effusion.   Bursae: No regional bursitis.   Muscles and tendons   Muscles and tendons:  Unremarkable   Other findings   Miscellaneous:   No supplemental non-categorized findings.   IMPRESSION: 1. Severe degenerative left hip arthropathy with mild flattening of the left upper femoral head, confluent subcortical marrow edema along the left femoral head and acetabulum, full-thickness loss of articular cartilage in the left hip, prominent spurring, and suspected non-fragmented osteochondral lesions of the femoral head and adjacent acetabulum. Small left hip joint effusion. 2. Mild to moderate degenerative hip arthropathy on the right. 3. Degenerative disc disease at L5-S1.     Electronically Signed   By: Van Clines M.D.   On: 02/04/2023 15:30     Result History    PMFS History: Patient Active Problem List   Diagnosis Date Noted   Unilateral primary osteoarthritis, left hip 11/20/2022   Chronic pain of left knee 10/27/2019   Prediabetes 09/15/2018   Preventative health care 09/09/2017   Borderline hyperlipidemia 09/09/2017   TOBACCO ABUSE 11/24/2009   FATIGUE 11/24/2009   No past medical history on file.  Family History  Problem Relation Age of Onset   Hyperlipidemia Mother    Arthritis Mother     No past surgical history on file. Social History   Occupational History   Not on file  Tobacco Use   Smoking status: Light Smoker   Smokeless  tobacco: Current   Tobacco comments:    Vape  Substance and Sexual Activity   Alcohol use: No    Alcohol/week: 0.0 standard drinks of alcohol   Drug use: No   Sexual activity: Not on file

## 2023-11-21 DIAGNOSIS — J029 Acute pharyngitis, unspecified: Secondary | ICD-10-CM | POA: Diagnosis not present

## 2023-12-18 ENCOUNTER — Ambulatory Visit: Payer: BC Managed Care – PPO | Admitting: General Practice

## 2023-12-26 ENCOUNTER — Ambulatory Visit (INDEPENDENT_AMBULATORY_CARE_PROVIDER_SITE_OTHER): Payer: BC Managed Care – PPO | Admitting: General Practice

## 2023-12-26 ENCOUNTER — Encounter: Payer: Self-pay | Admitting: General Practice

## 2023-12-26 VITALS — BP 110/62 | HR 70 | Temp 98.6°F | Ht 70.5 in | Wt 308.0 lb

## 2023-12-26 DIAGNOSIS — M25562 Pain in left knee: Secondary | ICD-10-CM

## 2023-12-26 DIAGNOSIS — R7303 Prediabetes: Secondary | ICD-10-CM

## 2023-12-26 DIAGNOSIS — Z114 Encounter for screening for human immunodeficiency virus [HIV]: Secondary | ICD-10-CM

## 2023-12-26 DIAGNOSIS — Z1159 Encounter for screening for other viral diseases: Secondary | ICD-10-CM

## 2023-12-26 DIAGNOSIS — E785 Hyperlipidemia, unspecified: Secondary | ICD-10-CM

## 2023-12-26 DIAGNOSIS — Z125 Encounter for screening for malignant neoplasm of prostate: Secondary | ICD-10-CM | POA: Diagnosis not present

## 2023-12-26 DIAGNOSIS — Z1211 Encounter for screening for malignant neoplasm of colon: Secondary | ICD-10-CM

## 2023-12-26 DIAGNOSIS — Z23 Encounter for immunization: Secondary | ICD-10-CM

## 2023-12-26 DIAGNOSIS — Z0000 Encounter for general adult medical examination without abnormal findings: Secondary | ICD-10-CM

## 2023-12-26 DIAGNOSIS — G8929 Other chronic pain: Secondary | ICD-10-CM

## 2023-12-26 DIAGNOSIS — Z720 Tobacco use: Secondary | ICD-10-CM

## 2023-12-26 DIAGNOSIS — E669 Obesity, unspecified: Secondary | ICD-10-CM | POA: Insufficient documentation

## 2023-12-26 LAB — COMPREHENSIVE METABOLIC PANEL
ALT: 17 U/L (ref 0–53)
AST: 16 U/L (ref 0–37)
Albumin: 4.2 g/dL (ref 3.5–5.2)
Alkaline Phosphatase: 79 U/L (ref 39–117)
BUN: 12 mg/dL (ref 6–23)
CO2: 28 meq/L (ref 19–32)
Calcium: 10.2 mg/dL (ref 8.4–10.5)
Chloride: 103 meq/L (ref 96–112)
Creatinine, Ser: 0.78 mg/dL (ref 0.40–1.50)
GFR: 101.92 mL/min (ref >60.00)
Glucose, Bld: 92 mg/dL (ref 70–99)
Potassium: 4.2 meq/L (ref 3.5–5.1)
Sodium: 136 meq/L (ref 135–145)
Total Bilirubin: 1 mg/dL (ref 0.2–1.2)
Total Protein: 7.6 g/dL (ref 6.0–8.3)

## 2023-12-26 LAB — LIPID PANEL
Cholesterol: 191 mg/dL (ref 0–200)
HDL: 39.2 mg/dL (ref >39.00)
LDL Cholesterol: 127 mg/dL — ABNORMAL HIGH (ref 0–99)
NonHDL: 151.8
Total CHOL/HDL Ratio: 5
Triglycerides: 126 mg/dL (ref 0.0–149.0)
VLDL: 25.2 mg/dL (ref 0.0–40.0)

## 2023-12-26 LAB — CBC
HCT: 51.2 % (ref 39.0–52.0)
Hemoglobin: 17.2 g/dL — ABNORMAL HIGH (ref 13.0–17.0)
MCHC: 33.5 g/dL (ref 30.0–36.0)
MCV: 94.9 fL (ref 78.0–100.0)
Platelets: 240 10*3/uL (ref 150.0–400.0)
RBC: 5.4 Mil/uL (ref 4.22–5.81)
RDW: 13.2 % (ref 11.5–15.5)
WBC: 7.1 10*3/uL (ref 4.0–10.5)

## 2023-12-26 LAB — TSH: TSH: 3.07 u[IU]/mL (ref 0.35–5.50)

## 2023-12-26 LAB — HEMOGLOBIN A1C: Hgb A1c MFr Bld: 5.3 % (ref 4.6–6.5)

## 2023-12-26 LAB — PSA: PSA: 0.47 ng/mL (ref 0.10–4.00)

## 2023-12-26 NOTE — Assessment & Plan Note (Addendum)
Immunizations tetanus due. Declines flu. Will think about shingles vaccine.  Colonoscopy due, referral placed. PSA due and pending.  Discussed the importance of a healthy diet and regular exercise in order for weight loss, and to reduce the risk of further co-morbidity.  Exam stable. Labs pending.  Follow up in 1 year for repeat physical.

## 2023-12-26 NOTE — Assessment & Plan Note (Signed)
Chronic. Following with ortho.   Encourage exercise as tolerated.   Ibuprofen as needed for pain relief.

## 2023-12-26 NOTE — Assessment & Plan Note (Signed)
Discussed the importance of diet and exercise. Exercise is limited for him due to arthritis in hip. He is currently under orthopedics care. They have asked him to work on weight loss and then he will be considered for hip replacement surgery.   Labs pending.   Schedule two week follow up to discuss medication or surgery options.

## 2023-12-26 NOTE — Patient Instructions (Signed)
Stop by the lab prior to leaving today. I will notify you of your results once received.   It was a pleasure meeting you!  

## 2023-12-26 NOTE — Assessment & Plan Note (Signed)
Hemoglobin A1c pending. 

## 2023-12-26 NOTE — Assessment & Plan Note (Signed)
He has quit cigarette smoking however he is vaping. Discussed cessation. He is going to try.

## 2023-12-26 NOTE — Progress Notes (Signed)
New Patient Office Visit  Subjective    Patient ID: Ricardo Clark, male    DOB: [birth year]  Age: 53 y.o. MRN: [id number]  CC:  Chief Complaint  Patient presents with   Establish Care    Would like CPE if can     HPI Ricardo Clark is a 53 y.o. male presents to establish care, for complete physical and follow up of chronic conditions.   Immunizations: -Tetanus: 2010 (due) -Influenza: declines -Shingles: due; he will let me know   Diet: Fair diet. Eats a lot of [place] food.  Exercise: No regular exercise. Limited due to hip. He does do some walking.   Eye exam: Completes annually  Dental exam: Completes semi-annually    Colonoscopy: never completed, due   Outpatient Encounter Medications as of 12/26/2023  Medication Sig   [DISCONTINUED] lidocaine (XYLOCAINE) 2 % solution    [DISCONTINUED] cetirizine (ZYRTEC) 10 MG tablet Take 1 tablet by mouth daily.   [DISCONTINUED] meloxicam (MOBIC) 7.5 MG tablet Take 1 tablet (7.5 mg total) by mouth daily.   No facility-administered encounter medications on file as of 12/26/2023.    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 No pertinent surgical history.  Family History  Problem Relation Age of Onset   Stroke Mother    Hyperlipidemia Mother    Arthritis Mother     Social History   Socioeconomic History   Marital status: Married    Spouse name: Charisse March   Number of children: 1   Years of education: Not on file   Highest education level: Some college, no degree  Occupational History   Not on file  Tobacco Use   Smoking status: Former    Current packs/day: 0.00    Average packs/day: 1 pack/day for 18.0 years (18.0 ttl pk-yrs)    Types: Cigarettes    Start date: 7    Quit date: 2017    Years since quitting: 8.0   Smokeless tobacco: Current   Tobacco comments:    Smoked cigarrettes then e cig  Vaping Use   Vaping status: Every Day  Substance and  Sexual Activity   Alcohol use: No   Drug use: No   Sexual activity: Not Currently    Birth control/protection: None  Other Topics Concern   Not on file  Social History Narrative   Married.   1 child.   [place]   Enjoys golfing.    Social Drivers of Corporate investment banker Strain: Low Risk  (12/25/2023)   Overall Financial Resource Strain (CARDIA)    Difficulty of Paying Living Expenses: Not very hard  Food Insecurity: No Food Insecurity (12/25/2023)   Hunger Vital Sign    Worried About Running Out of Food in the Last Year: Never true    Ran Out of Food in the Last Year: Never true  Transportation Needs: No Transportation Needs (12/25/2023)   PRAPARE - Administrator, Civil Service (Medical): No    Lack of Transportation (Non-Medical): No  Physical Activity: Insufficiently Active (12/25/2023)   Exercise Vital Sign    Days of Exercise per Week: 1 day    Minutes of Exercise per Session: 10 min  Stress: No Stress Concern Present (12/25/2023)   [employer] of Occupational Health - Occupational Stress Questionnaire    Feeling of Stress : Only a little  Social Connections: Moderately Integrated (12/25/2023)   Social  Connection and Isolation Panel [NHANES]    Frequency of Communication with Friends and Family: More than three times a week    Frequency of Social Gatherings with Friends and Family: Once a week    Attends Religious Services: 1 to 4 times per year    Active Member of [employer] or Organizations: No    Attends Engineer, structural: Not on file    Marital Status: Married  Catering manager Violence: Not on file    Review of Systems  Constitutional:  Negative for chills, fever, malaise/fatigue and weight loss.  HENT:  Negative for congestion, ear discharge, ear pain, hearing loss, nosebleeds, sinus pain, sore throat and tinnitus.   Eyes:  Negative for blurred vision, double vision, pain, discharge and redness.   Respiratory:  Negative for cough, shortness of breath, wheezing and stridor.   Cardiovascular:  Negative for chest pain, palpitations and leg swelling.  Gastrointestinal:  Negative for abdominal pain, constipation, diarrhea, heartburn, nausea and vomiting.  Genitourinary:  Negative for dysuria, frequency and urgency.  Musculoskeletal:  Negative for myalgias.       Hip pain.  Skin:  Negative for rash.  Neurological:  Negative for dizziness, tingling, seizures, weakness and headaches.  Psychiatric/Behavioral:  Negative for depression, substance abuse and suicidal ideas. The patient is not nervous/anxious.         Objective    BP 110/62   Pulse 70   Temp 98.6 F (37 C) (Temporal)   Ht 5' 10.5" (1.791 m)   Wt (!) 308 lb (139.7 kg)   SpO2 96%   BMI 43.57 kg/m   Physical Exam Vitals and nursing note reviewed.  Constitutional:      Appearance: Normal appearance.  HENT:     Head: Normocephalic and atraumatic.     Right Ear: Tympanic membrane, ear canal and external ear normal.     Left Ear: Tympanic membrane, ear canal and external ear normal.     Nose: Nose normal.     Mouth/Throat:     Mouth: Mucous membranes are moist.     Pharynx: Oropharynx is clear.  Eyes:     Conjunctiva/sclera: Conjunctivae normal.     Pupils: Pupils are equal, round, and reactive to light.  Cardiovascular:     Rate and Rhythm: Normal rate and regular rhythm.     Pulses: Normal pulses.     Heart sounds: Normal heart sounds.  Pulmonary:     Effort: Pulmonary effort is normal.     Breath sounds: Normal breath sounds.  Abdominal:     General: Abdomen is flat. Bowel sounds are normal.     Palpations: Abdomen is soft.  Musculoskeletal:        General: Normal range of motion.     Cervical back: Normal range of motion.  Skin:    General: Skin is warm and dry.     Capillary Refill: Capillary refill takes less than 2 seconds.  Neurological:     General: No focal deficit present.     Mental Status:  He is alert and oriented to person, place, and time. Mental status is at baseline.  Psychiatric:        Mood and Affect: Mood normal.        Behavior: Behavior normal.        Thought Content: Thought content normal.        Judgment: Judgment normal.         Assessment & Plan:  Prediabetes Assessment & Plan: Hemoglobin A1c  pending.  Orders: -     Hemoglobin A1c  Preventative health care Assessment & Plan: Immunizations tetanus due. Declines flu. Will think about shingles vaccine.  Colonoscopy due, referral placed. PSA due and pending.  Discussed the importance of a healthy diet and regular exercise in order for weight loss, and to reduce the risk of further co-morbidity.  Exam stable. Labs pending.  Follow up in 1 year for repeat physical.    Borderline hyperlipidemia Assessment & Plan: Lipid panel pending.  Orders: -     Lipid panel  Morbid obesity (HCC) Assessment & Plan: Discussed the importance of diet and exercise. Exercise is limited for him due to arthritis in hip. He is currently under orthopedics care. They have asked him to work on weight loss and then he will be considered for hip replacement surgery.   Labs pending.   Schedule two week follow up to discuss medication or surgery options.  Orders: -     CBC -     Comprehensive metabolic panel -     TSH  Screening for colon cancer -     Ambulatory referral to Gastroenterology  Screening for HIV (human immunodeficiency virus) -     HIV Antibody (routine testing w rflx)  Need for hepatitis C screening test -     Hepatitis C antibody  Tobacco abuse Assessment & Plan: He has quit cigarette smoking however he is vaping. Discussed cessation. He is going to try.    Screening for prostate cancer -     PSA  Chronic pain of left knee Assessment & Plan: Chronic. Following with ortho.   Encourage exercise as tolerated.   Ibuprofen as needed for pain relief.      Return in about 2 weeks  (around 01/09/2024) for weightloss options. [place] Charon, NP

## 2023-12-26 NOTE — Assessment & Plan Note (Signed)
Lipid panel pending.

## 2023-12-27 LAB — HIV ANTIBODY (ROUTINE TESTING W REFLEX): HIV 1&2 Ab, 4th Generation: NONREACTIVE

## 2023-12-27 LAB — HEPATITIS C ANTIBODY: Hepatitis C Ab: NONREACTIVE

## 2024-01-09 ENCOUNTER — Ambulatory Visit (INDEPENDENT_AMBULATORY_CARE_PROVIDER_SITE_OTHER): Payer: BC Managed Care – PPO | Admitting: General Practice

## 2024-01-09 ENCOUNTER — Encounter: Payer: Self-pay | Admitting: General Practice

## 2024-01-09 DIAGNOSIS — E7800 Pure hypercholesterolemia, unspecified: Secondary | ICD-10-CM | POA: Insufficient documentation

## 2024-01-09 DIAGNOSIS — Z6841 Body Mass Index (BMI) 40.0 and over, adult: Secondary | ICD-10-CM | POA: Diagnosis not present

## 2024-01-09 NOTE — Assessment & Plan Note (Addendum)
Discussed a low fat low cholesterol diet. Handout given.  Will continue to monitor.

## 2024-01-09 NOTE — Progress Notes (Signed)
0  Established Patient Office Visit  Subjective   Patient ID: Ricardo Clark, male    DOB: 09-14-1971  Age: 53 y.o. MRN: [id number]  Chief Complaint  Patient presents with   Weight Loss    Wants to discuss options for weight loss; has never tried anything in the past.     HPI  Ricardo Clark is 53 year old male with past medical history of prediabetes, morbid obesity, elevated LDL, presents today to discuss weight loss options.   Weight Loss: Chronic. At one time in his life, he used to be 160 lbs. He used to smoke cigarettes in the past and then when he quit he noticed that he started gaining weight. He is still vaping. He needs to loose weight in order to have his left hip replacement. He was previously getting steroid injections but he has stopped those now. The surgeon wants him to loose weight and get his BMI under 40. He has been watching his diet and his exercise is limited due to pain. He does not want to take any medications if he does not have to. He reports that he does not eat breakfast or lunch and usually eats a lot for dinner. Feels that could be the reason why he is not able to loose weight.   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   No Known Allergies       01/09/2024    8:15 AM 12/26/2023   11:53 AM 09/15/2018   10:32 AM  Depression screen PHQ 2/9  Decreased Interest 0 0 0  Down, Depressed, Hopeless 0 0 0  PHQ - 2 Score 0 0 0  Altered sleeping 0 0   Tired, decreased energy 0 1   Change in appetite 0 0   Feeling bad or failure about yourself  0 0   Trouble concentrating  0 0   Moving slowly or fidgety/restless 0 0   Suicidal thoughts 0 0   PHQ-9 Score 0 1   Difficult doing work/chores Not difficult at all Not difficult at all        01/09/2024    8:15 AM 12/26/2023   11:53 AM  GAD 7 : Generalized Anxiety Score  Nervous, Anxious, on Edge 0 1  Control/stop worrying 0 0  Worry too much - different things 0 0  Trouble relaxing 0 0  Restless 0 0  Easily annoyed or irritable 0 0  Afraid - awful might happen 0 0  Total GAD 7 Score 0 1  Anxiety Difficulty Not difficult at all Not difficult at all      Review of Systems  Constitutional:  Negative for chills and fever.  Respiratory:  Negative for shortness of breath.   Cardiovascular:  Negative for chest pain.  Gastrointestinal:  Negative for abdominal pain, constipation, diarrhea, heartburn, nausea and vomiting.  Genitourinary:  Negative for dysuria, frequency and urgency.  Musculoskeletal:        Chronic Left hip pain.  Neurological:  Negative for dizziness and headaches.  Endo/Heme/Allergies:  Negative for polydipsia.  Psychiatric/Behavioral:  Negative for depression and suicidal ideas. The patient is not  nervous/anxious.       Objective:     BP 124/82 (BP Location: Left Arm, Patient Position: Sitting, Cuff Size: Large)   Pulse 74   Temp 98 F (36.7 C) (Oral)   Ht 5' 10.5" (1.791 m)   Wt (!) 309 lb (140.2 kg)   SpO2 94%   BMI 43.71 kg/m  BP Readings from Last 3 Encounters:  01/09/24 124/82  12/26/23 110/62  02/11/23 124/87   Wt Readings from Last 3 Encounters:  01/09/24 (!) 309 lb (140.2 kg)  12/26/23 (!) 308 lb (139.7 kg)  02/11/23 295 lb 6.4 oz (134 kg)      Physical Exam Vitals and nursing note reviewed.  Constitutional:      Appearance: Normal appearance.  Cardiovascular:     Rate and Rhythm: Normal rate and regular rhythm.     Pulses: Normal pulses.     Heart sounds: Normal heart sounds.  Pulmonary:     Effort: Pulmonary effort is normal.     Breath sounds: Normal  breath sounds.  Neurological:     Mental Status: He is alert and oriented to person, place, and time.  Psychiatric:        Mood and Affect: Mood normal.        Behavior: Behavior normal.        Thought Content: Thought content normal.        Judgment: Judgment normal.      No results found for any visits on 01/09/24.     The 10-year ASCVD risk score (Arnett DK, et al., 2019) is: 5.2%    Assessment & Plan:  Morbid obesity (HCC) Assessment & Plan: Uncontrolled.   His current BMI 43.71 and orthopedics would like his BMI to be under 40 for him to have hip replacement.   Labs look unremarkable other than elevated LDL.  He has been monitoring his diet and eats a lot of vegetable. His exercise is limited due to hip pain.   At this time, he is not interested in medications and would like to go to [facility].  Discussed with him to start a diary of his daily food intake and exercise.   Referral placed.  Orders: -     Amb Ref to Medical Weight Management  Elevated LDL cholesterol level Assessment & Plan: Discussed a low fat low cholesterol diet. Handout given.  Will continue to monitor.      Return in about 1 year (around 12/26/2024).    [place] Charon, NP

## 2024-01-09 NOTE — Assessment & Plan Note (Signed)
Uncontrolled.   His current BMI 43.71 and orthopedics would like his BMI to be under 40 for him to have hip replacement.   Labs look unremarkable other than elevated LDL.  He has been monitoring his diet and eats a lot of vegetable. His exercise is limited due to hip pain.   At this time, he is not interested in medications and would like to [facility] to [facility].  Discussed with him to start a diary of his daily food intake and exercise.   Referral placed.

## 2024-01-09 NOTE — Patient Instructions (Addendum)
You will either be contacted via phone regarding your referral to weight management clinic , or you may receive a letter on your MyChart portal from our referral team with instructions for scheduling an appointment. Please let us know if you have not been contacted by anyone within two weeks.   Schedule physical for next year.   It was a pleasure to see you today!

## 2024-02-18 ENCOUNTER — Encounter (INDEPENDENT_AMBULATORY_CARE_PROVIDER_SITE_OTHER): Payer: Self-pay

## 2024-03-02 DIAGNOSIS — Z0289 Encounter for other administrative examinations: Secondary | ICD-10-CM

## 2024-03-03 ENCOUNTER — Encounter: Payer: Self-pay | Admitting: Nurse Practitioner

## 2024-03-03 ENCOUNTER — Ambulatory Visit (INDEPENDENT_AMBULATORY_CARE_PROVIDER_SITE_OTHER): Admitting: Nurse Practitioner

## 2024-03-03 VITALS — BP 132/87 | HR 74 | Temp 98.2°F | Ht 71.0 in | Wt 301.0 lb

## 2024-03-03 DIAGNOSIS — Z6841 Body Mass Index (BMI) 40.0 and over, adult: Secondary | ICD-10-CM | POA: Diagnosis not present

## 2024-03-03 DIAGNOSIS — E66813 Obesity, class 3: Secondary | ICD-10-CM

## 2024-03-03 DIAGNOSIS — E7849 Other hyperlipidemia: Secondary | ICD-10-CM | POA: Diagnosis not present

## 2024-03-03 NOTE — Progress Notes (Signed)
 Office: [phone]  /  Fax: [phone]   Initial Visit  Ricardo Clark was seen in clinic today to evaluate for obesity. He is interested in losing weight to improve overall health and reduce the risk of weight related complications. He presents today to review program treatment options, initial physical assessment, and evaluation.     He was referred by: PCP  When asked what else they would like to accomplish? He states: Adopt healthier eating patterns, Improve existing medical conditions, Reduce number of medications, and Improve quality of life   When asked how has your weight affected you? He states: Contributed to medical problems, Contributed to orthopedic problems or mobility issues, Having fatigue, and Having poor endurance  Some associated conditions: Prediabetes, elevated LDL, osteoarthritis, tobacco use/vaping  Contributing factors: Family history of obesity, Moderate to high levels of stress, Reduced physical activity, and when he stopped smoking in 2018  Weight promoting medications identified: Steroids  Current nutrition plan: None  Current level of physical activity: walking 15 mins daily  Current or previous pharmacotherapy: None  Response to medication: Never tried medications   Past medical history includes:   Past Medical History:  Diagnosis Date   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  Peak Weight:301 lbs , Body Fat%:39.1%, Visceral Fat Rating:25, Weight trend over the last 12 months: Increasing  General:  Alert, oriented and cooperative. Patient is in no acute distress.  Respiratory: Normal respiratory effort, no problems with respiration noted   Gait: able to ambulate independently  Mental Status: Normal mood and affect. Normal  behavior. Normal judgment and thought content.   DIAGNOSTIC DATA REVIEWED:  BMET    Component Value Date/Time   NA 136 12/26/2023 1124   K 4.2 12/26/2023 1124   CL 103 12/26/2023 1124   CO2 28 12/26/2023 1124   GLUCOSE 92 12/26/2023 1124   BUN 12 12/26/2023 1124   CREATININE 0.78 12/26/2023 1124   CALCIUM 10.2 12/26/2023 1124   GFRNONAA 114.22 11/24/2009 1114   GFRAA 122 11/26/2007 0848   Lab Results  Component Value Date   HGBA1C 5.3 12/26/2023   HGBA1C 5.1 09/05/2017   No results found for: "INSULIN" CBC    Component Value Date/Time   WBC 7.1 12/26/2023 1124   RBC 5.40 12/26/2023 1124   HGB 17.2 (H) 12/26/2023 1124   HCT 51.2 12/26/2023 1124   PLT 240.0 12/26/2023 1124   MCV 94.9 12/26/2023 1124   MCV 90.4 04/30/2015 1425   MCH 29.7 04/30/2015 1425   MCHC 33.5 12/26/2023 1124   RDW 13.2 12/26/2023 1124   Iron/TIBC/Ferritin/ %Sat No results found for: "IRON", "TIBC", "FERRITIN", "IRONPCTSAT" Lipid Panel     Component Value Date/Time   CHOL 191 12/26/2023 1124   TRIG 126.0 12/26/2023 1124   HDL 39.20 12/26/2023 1124   CHOLHDL 5 12/26/2023 1124   VLDL 25.2 12/26/2023 1124   LDLCALC 127 (H) 12/26/2023 1124   Hepatic Function Panel     Component Value Date/Time   PROT 7.6 12/26/2023 1124   ALBUMIN 4.2 12/26/2023 1124   AST 16 12/26/2023 1124   ALT 17 12/26/2023 1124   ALKPHOS 79 12/26/2023 1124   BILITOT 1.0 12/26/2023 1124  BILIDIR 0.1 11/24/2009 1114      Component Value Date/Time   TSH 3.07 12/26/2023 1124     Assessment and Plan:   Other hyperlipidemia Will continue to monitor  Class 3 severe obesity due to excess calories without serious comorbidity with body mass index (BMI) of 40.0 to 44.9 in adult [facility])        Obesity Treatment / Action Plan:  Patient will work on garnering support from family and friends to begin weight loss journey. Will work on eliminating or reducing the presence of highly palatable, calorie dense foods in the  home. Will complete provided nutritional and psychosocial assessment questionnaire before the next appointment. Will be scheduled for indirect calorimetry to determine resting energy expenditure in a fasting state.  This will allow [place] to create a reduced calorie, high-protein meal plan to promote loss of fat mass while preserving muscle mass. Counseled on the health benefits of losing 5%-15% of total body weight. Was counseled on nutritional approaches to weight loss and benefits of reducing processed foods and consuming plant-based foods and high quality protein as part of nutritional weight management. Was counseled on pharmacotherapy and role as an adjunct in weight management.   Obesity Education Performed Today:  He was weighed on the bioimpedance scale and results were discussed and documented in the synopsis.  We discussed obesity as a disease and the importance of a more detailed evaluation of all the factors contributing to the disease.  We discussed the importance of long term lifestyle changes which include nutrition, exercise and behavioral modifications as well as the importance of customizing this to his specific health and social needs.  We discussed the benefits of reaching a healthier weight to alleviate the symptoms of existing conditions and reduce the risks of the biomechanical, metabolic and psychological effects of obesity.  Ricardo Clark appears to be in the action stage of change and states they are ready to start intensive lifestyle modifications and behavioral modifications.  30 minutes was spent today on this visit including the above counseling, pre-visit chart review, and post-visit documentation.  Reviewed by clinician on day of visit: allergies, medications, problem list, medical history, surgical history, family history, social history, and previous encounter notes pertinent to obesity diagnosis.    Theodis Sato Dezire Turk FNP-C

## 2024-03-15 ENCOUNTER — Encounter: Payer: Self-pay | Admitting: Bariatrics

## 2024-03-15 ENCOUNTER — Ambulatory Visit (INDEPENDENT_AMBULATORY_CARE_PROVIDER_SITE_OTHER): Admitting: Bariatrics

## 2024-03-15 VITALS — BP 112/72 | HR 57 | Temp 97.9°F | Ht 71.0 in | Wt 304.0 lb

## 2024-03-15 DIAGNOSIS — R0602 Shortness of breath: Secondary | ICD-10-CM

## 2024-03-15 DIAGNOSIS — E559 Vitamin D deficiency, unspecified: Secondary | ICD-10-CM | POA: Diagnosis not present

## 2024-03-15 DIAGNOSIS — E66813 Obesity, class 3: Secondary | ICD-10-CM

## 2024-03-15 DIAGNOSIS — R7303 Prediabetes: Secondary | ICD-10-CM | POA: Diagnosis not present

## 2024-03-15 DIAGNOSIS — E7849 Other hyperlipidemia: Secondary | ICD-10-CM

## 2024-03-15 DIAGNOSIS — Z6841 Body Mass Index (BMI) 40.0 and over, adult: Secondary | ICD-10-CM

## 2024-03-15 DIAGNOSIS — R5383 Other fatigue: Secondary | ICD-10-CM | POA: Diagnosis not present

## 2024-03-15 DIAGNOSIS — E785 Hyperlipidemia, unspecified: Secondary | ICD-10-CM

## 2024-03-15 DIAGNOSIS — Z0000 Encounter for general adult medical examination without abnormal findings: Secondary | ICD-10-CM

## 2024-03-15 DIAGNOSIS — Z1331 Encounter for screening for depression: Secondary | ICD-10-CM

## 2024-03-15 NOTE — Progress Notes (Signed)
 At a Glance:  Vitals Temp: 97.9 F (36.6 C) BP: 112/72 Pulse Rate: (!) 57 SpO2: 96 %   Anthropometric Measurements Height: 5\' 11"  (1.803 m) Weight: (!) 304 lb (137.9 kg) BMI (Calculated): 42.42 Starting Weight: 304lb Peak Weight: 301lb   Body Composition  Body Fat %: 39.1 % Fat Mass (lbs): 119 lbs Muscle Mass (lbs): 176.2 lbs Total Body Water (lbs): 125.2 lbs Visceral Fat Rating : 25   Other Clinical Data Fasting: yes Labs: yes Today's Visit #: 1 Starting Date: 03/15/24    EKG: Normal sinus rhythm, rate 58.   Indirect Calorimeter:   Resting Metabolic Rate ( RMR):  RMR (actual): 2736 kcal RMR (calculated): 2596 kcal The calculated basal metabolic rate is 1610 kcal thus his basal metabolic rate is better than expected.  Plan:   Indirect calorimeter completed, interpreted and reviewed with patient today and allowed to ask questions.  Discussed the implications for the chosen plan and exercise based on the RMR reading.  Will consider repeating the RMR in the future based on weight loss.    Chief Complaint:  Obesity   Subjective:  Ricardo Clark (MR# [id number]) is a 53 y.o. male who presents for evaluation and treatment of obesity and related comorbidities.   [name] is currently in the action stage of change and ready to dedicate time achieving and maintaining a healthier weight. [name] is interested in becoming our patient and working on intensive lifestyle modifications including (but not limited to) diet and exercise for weight loss.  [name] has been struggling with his weight. He has been unsuccessful in either losing weight, maintaining weight loss, or reaching his healthy weight goal.  [name]'s habits were reviewed today and are as follows: His family eats meals together, he thinks his family will eat healthier with him, and he started gaining weight after quitting smoking. .  Current or previous pharmacotherapy: None  Response to medication: Never  tried medications  Other Fatigue Rufus admits to daytime somnolence and admits to waking up still tired. Darivs generally gets 5 hours of sleep per night, and states that he has generally restful sleep. Snoring are not present. Apneic episodes are not present. Epworth Sleepiness Score is 11.   Shortness of Breath [place] notes increasing shortness of breath with exercising and seems to be worsening over time with weight gain. He notes getting out of breath sooner with activity than he used to. This has gotten worse recently. Dsean denies shortness of breath at rest or orthopnea.  Depression Screen [name]'s Food and Mood (modified PHQ-9) score was 9. 5-9 mild depression     01/09/2024    8:15 AM  Depression screen PHQ 2/9  Decreased Interest 0  Down, Depressed, Hopeless 0  PHQ - 2 Score 0  Altered sleeping 0  Tired, decreased energy 0  Change in appetite 0  Feeling bad or failure about yourself  0  Trouble concentrating 0  Moving slowly or fidgety/restless 0  Suicidal thoughts 0  PHQ-9 Score 0  Difficult doing work/chores Not difficult at all     Assessment and Plan:   Other Fatigue [name] does feel that his weight is causing his energy to be lower than it should be. Fatigue may be related to obesity, depression or many other causes. Labs will be ordered, and in the meanwhile, [name] will focus on self care including making healthy food choices, increasing physical activity and focusing on stress reduction.  Shortness of Breath [name] does feel that he gets out of  breath more easily that he used to when he exercises. [name]'s shortness of breath appears to be obesity related and exercise induced. He has agreed to work on weight loss and gradually increase exercise to treat his exercise induced shortness of breath. Will continue to monitor closely.  Health Maintenance:   Obesity   Plan: Will do EKG, indirect calorimetry, and labs.     Vitamin D Deficiency He is at risk for vitamin  D deficiency due to obesity.  He is on MV intermittently    Plan: Will check for vitamin D deficiency.   Prediabetes Last A1c was 5.3 Highest Hgb was 5.7.  Medication(s): non Lab Results  Component Value Date   HGBA1C 5.3 12/26/2023   HGBA1C 5.1 10/20/2019   HGBA1C 5.7 (A) 09/15/2018   HGBA1C 5.1 09/05/2017    Plan: Will minimize all refined carbohydrates both sweets and starches.  Will work on the plan and exercise.  Consider both aerobic and resistance training.  Will keep protein, water, and fiber intake high.  Increase Polyunsaturated and Monounsaturated fats to increase satiety and encourage weight loss.  Aim for 7 to 9 hours of sleep nightly.  Will continue medications.   Hyperlipidemia LDL is not at goal. Medication(s): none Cardiovascular risk factors: male gender, obesity (BMI >= 30 kg/m2), and sedentary lifestyle  Lab Results  Component Value Date   CHOL 191 12/26/2023   HDL 39.20 12/26/2023   LDLCALC 127 (H) 12/26/2023   TRIG 126.0 12/26/2023   CHOLHDL 5 12/26/2023   Lab Results  Component Value Date   ALT 17 12/26/2023   AST 16 12/26/2023   ALKPHOS 79 12/26/2023   BILITOT 1.0 12/26/2023   The 10-year ASCVD risk score (Arnett DK, et al., 2019) is: 4.4%   Values used to calculate the score:     Age: 26 years     Sex: Male     Is Non-Hispanic African American: No     Diabetic: No     Tobacco smoker: No     Systolic Blood Pressure: 112 mmHg     Is BP treated: No     HDL Cholesterol: 39.2 mg/dL     Total Cholesterol: 191 mg/dL  Plan:  Will avoid all trans fats.  Will read labels Will minimize saturated fats except the following: low fat meats in moderation, diary, and limited dark chocolate.  Increase Omega 3 in foods, and consider an Omega 3 supplement.    [name] had a positive depression screening. Depression is commonly associated with obesity and often results in emotional eating behaviors. We will monitor this closely and work on CBT to help  improve the non-hunger eating patterns. Referral to Psychology may be required if no improvement is seen as he continues in our clinic.    Previous labs reviewed today. Date: 12/26/2023 CMP, Lipids, HgbA1c, and CBC, glucose, and TSH.   Labs done today CMP, Insulin, and Vit D   Morbid Obesity: BMI (Calculated): 42.42   Nicholai is currently in the action stage of change and his goal is to begin weight loss efforts. I recommend Gilad begin the structured treatment plan as follows:  He has agreed to keeping a food journal and adhering to recommended goals of 2,000 to 2,200  calories and 120 to 150 grams  protein  Exercise goals: All adults should avoid inactivity. Some activity is better than none, and adults who participate in any amount of physical activity, gain some health benefits.  Behavioral modification strategies:increasing lean protein intake,  decreasing simple carbohydrates, increasing vegetables, increase high fiber foods, no skipping meals, meal planning and cooking strategies, keeping healthy foods in the home, better snacking choices, and planning for success  He was informed of the importance of frequent follow-up visits to maximize his success with intensive lifestyle modifications for his multiple health conditions. He was informed we would discuss his lab results at his next visit unless there is a critical issue that needs to be addressed sooner. Braydon agreed to keep his next visit at the agreed upon time to discuss these results.  Objective:  General: Cooperative, alert, well developed, in no acute distress. HEENT: Conjunctivae and lids unremarkable. Cardiovascular: Regular rhythm.  Lungs: Normal work of breathing. Neurologic: No focal deficits.   Lab Results  Component Value Date   CREATININE 0.78 12/26/2023   BUN 12 12/26/2023   NA 136 12/26/2023   K 4.2 12/26/2023   CL 103 12/26/2023   CO2 28 12/26/2023   Lab Results  Component Value Date   ALT 17 12/26/2023    AST 16 12/26/2023   ALKPHOS 79 12/26/2023   BILITOT 1.0 12/26/2023   Lab Results  Component Value Date   HGBA1C 5.3 12/26/2023   HGBA1C 5.1 10/20/2019   HGBA1C 5.7 (A) 09/15/2018   HGBA1C 5.1 09/05/2017   No results found for: "INSULIN" Lab Results  Component Value Date   TSH 3.07 12/26/2023   Lab Results  Component Value Date   CHOL 191 12/26/2023   HDL 39.20 12/26/2023   LDLCALC 127 (H) 12/26/2023   TRIG 126.0 12/26/2023   CHOLHDL 5 12/26/2023   Lab Results  Component Value Date   WBC 7.1 12/26/2023   HGB 17.2 (H) 12/26/2023   HCT 51.2 12/26/2023   MCV 94.9 12/26/2023   PLT 240.0 12/26/2023   No results found for: "IRON", "TIBC", "FERRITIN"  Attestation Statements:  Applicable history such as the following:  allergies, medications, problem list, medical history, surgical history, family history, social history, and previous encounter notes reviewed by clinician on day of visit:   Time spent on visit in care of the patient today including the items listed below was 43 minutes.   I reviewed the labs which were ordered on )12/26/23  20 minutes were spent talking about the history, 20 minutes for face to face counseling implementing the plan, discussing the specifics of how to arrange meals, meal planning, water intake.   I spent face to face time discussing his/her plan, including breakfast, additional breakfast options, lunch, and dinner options, grocery list, and snacks.  I reviewed her indirect calorimetry. I discussed the implications for the diet plan.    Discussed the bio-impedence test (fat %, muscle mass, and water weight) and allowed the patient to ask questions.   I additionally spent time documenting, reviewing, and checking the codes before submitting.    This may have been prepared with the assistance of Engineer, civil (consulting).  Occasional wrong-word or sound-a-like substitutions may have occurred due to the inherent limitations of voice  recognition software.    Corinna Capra, DO

## 2024-03-16 ENCOUNTER — Encounter: Payer: Self-pay | Admitting: Bariatrics

## 2024-03-16 DIAGNOSIS — E88819 Insulin resistance, unspecified: Secondary | ICD-10-CM | POA: Insufficient documentation

## 2024-03-16 DIAGNOSIS — E559 Vitamin D deficiency, unspecified: Secondary | ICD-10-CM | POA: Insufficient documentation

## 2024-03-16 LAB — COMPREHENSIVE METABOLIC PANEL WITH GFR
ALT: 24 IU/L (ref 0–44)
AST: 21 IU/L (ref 0–40)
Albumin: 4.3 g/dL (ref 3.8–4.9)
Alkaline Phosphatase: 106 IU/L (ref 44–121)
BUN/Creatinine Ratio: 21 — ABNORMAL HIGH (ref 9–20)
BUN: 17 mg/dL (ref 6–24)
Bilirubin Total: 0.5 mg/dL (ref 0.0–1.2)
CO2: 18 mmol/L — ABNORMAL LOW (ref 20–29)
Calcium: 10.2 mg/dL (ref 8.7–10.2)
Chloride: 106 mmol/L (ref 96–106)
Creatinine, Ser: 0.82 mg/dL (ref 0.76–1.27)
Globulin, Total: 3.2 g/dL (ref 1.5–4.5)
Glucose: 104 mg/dL — ABNORMAL HIGH (ref 70–99)
Potassium: 4.5 mmol/L (ref 3.5–5.2)
Sodium: 142 mmol/L (ref 134–144)
Total Protein: 7.5 g/dL (ref 6.0–8.5)
eGFR: 105 mL/min/{1.73_m2} (ref >59)

## 2024-03-16 LAB — VITAMIN D 25 HYDROXY (VIT D DEFICIENCY, FRACTURES): Vit D, 25-Hydroxy: 19.3 ng/mL — ABNORMAL LOW (ref 30.0–100.0)

## 2024-03-16 LAB — INSULIN, RANDOM: INSULIN: 36.9 u[IU]/mL — ABNORMAL HIGH (ref 2.6–24.9)

## 2024-03-29 ENCOUNTER — Ambulatory Visit (INDEPENDENT_AMBULATORY_CARE_PROVIDER_SITE_OTHER): Admitting: Bariatrics

## 2024-03-29 ENCOUNTER — Encounter: Payer: Self-pay | Admitting: Bariatrics

## 2024-03-29 VITALS — BP 130/78 | HR 71 | Temp 98.1°F | Ht 71.0 in | Wt 292.0 lb

## 2024-03-29 DIAGNOSIS — E88819 Insulin resistance, unspecified: Secondary | ICD-10-CM

## 2024-03-29 DIAGNOSIS — Z6841 Body Mass Index (BMI) 40.0 and over, adult: Secondary | ICD-10-CM

## 2024-03-29 DIAGNOSIS — E559 Vitamin D deficiency, unspecified: Secondary | ICD-10-CM

## 2024-03-29 DIAGNOSIS — E66813 Obesity, class 3: Secondary | ICD-10-CM

## 2024-03-29 MED ORDER — VITAMIN D (ERGOCALCIFEROL) 1.25 MG (50000 UNIT) PO CAPS: 50000.0000 [IU] | ORAL_CAPSULE | ORAL | 0 refills | Status: DC

## 2024-03-29 NOTE — Progress Notes (Signed)
 First follow-up after initial visit.        WEIGHT SUMMARY AND BIOMETRICS  Weight Lost Since Last Visit: 12lb  Weight Gained Since Last Visit: 0   Vitals Temp: 98.1 F (36.7 C) BP: 130/78 Pulse Rate: 71 SpO2: 97 %   Anthropometric Measurements Height: 5\' 11"  (1.803 m) Weight: 292 lb (132.5 kg) BMI (Calculated): 40.74 Weight at Last Visit: 304lb Weight Lost Since Last Visit: 12lb Weight Gained Since Last Visit: 0 Starting Weight: 304lb Total Weight Loss (lbs): 12 lb (5.443 kg) Peak Weight: 301lb   Body Composition  Body Fat %: 39 % Fat Mass (lbs): 114.2 lbs Muscle Mass (lbs): 169.6 lbs Total Body Water (lbs): 124 lbs Visceral Fat Rating : 24   Other Clinical Data Fasting: no Labs: no Today's Visit #: 2 Starting Date: 03/15/24    OBESITY Ricardo Clark is here to discuss his progress with his obesity treatment plan along with follow-up of his obesity related diagnoses.    Nutrition Plan: keeping a food journal with goal of 2,000-2,200 calories and 120-150 grams of protein daily - 95% adherence.  Current exercise: walking  Interim History:  He is down 12 lbs since his first visit.  Not eating all of the food on the plan., Protein intake is as prescribed, Is skipping meals, and Water intake is adequate.  Initial positives regarding the dietary plan: Was able to stick to the plan.  Initial challenges regarding  the dietary plan: Sweets and getting organized.   Assessment/Plan:   Insulin  Resistance Ricardo Clark has had elevated fasting insulin  readings. Goal is HgbA1c < 5.7, fasting insulin  at l0 or less, and preferably at 5.  He reports polyphagia. Medication(s): none Lab Results  Component Value Date   HGBA1C 5.3 12/26/2023   Lab Results  Component Value Date   INSULIN  36.9 (H) 03/15/2024    Plan Medication(s):  Does not want to start  medications at this time.  Will work on the agreed upon plan. Will minimize refined carbohydrates ( sweets and starches), and focus more on complex carbohydrates.  Increase the micronutrients found in leafy greens, which include magnesium, polyphenols, and vitamin C which have been postulated to help with insulin  sensitivity. Minimize "fast food" and cook more meals at home.  Increase fiber to 25 to 30 grams daily.  Will start journaling. Information sheet on prediabetes and insulin  resistance.    Morbid Obesity: Current BMI BMI (Calculated): 40.74    Ricardo Clark is currently in the action stage of change. As such, his goal is to continue with weight loss efforts.  He has agreed to keeping a food journal with goal of 2,000 to 2,200 calories and 120 to 150 grams of protein daily.  Exercise goals: All adults should avoid inactivity. Some physical activity is better than none, and adults who participate in any amount of physical activity gain some health benefits.  Behavioral modification strategies: increasing lean protein intake, no meal skipping, meal planning , decrease liquid calories, increase water intake, better snacking choices, planning for success, increasing vegetables, and increasing fiber rich foods.  Ricardo Clark has agreed to follow-up with our clinic in 2 weeks.      Labs reviewed today from last visit (CMP, Lipids, HgbA1c, insulin , vitamin D , B 12, and thyroid  panel).   Objective:   VITALS: Per patient if applicable, see vitals. GENERAL: Alert and in no acute distress. CARDIOPULMONARY: No increased WOB. Speaking in clear sentences.  PSYCH: Pleasant and cooperative. Speech normal rate and rhythm. Affect is appropriate. Insight and  judgement are appropriate. Attention is focused, linear, and appropriate.  NEURO: Oriented as arrived to appointment on time with no prompting.   Attestation Statements:   This was prepared with the assistance of Engineer, civil (consulting).  Occasional  wrong-word or sound-a-like substitutions may have occurred due to the inherent limitations of voice recognition software.   Kirk Peper, DO

## 2024-04-14 ENCOUNTER — Encounter: Payer: Self-pay | Admitting: Bariatrics

## 2024-04-14 ENCOUNTER — Ambulatory Visit (INDEPENDENT_AMBULATORY_CARE_PROVIDER_SITE_OTHER): Admitting: Bariatrics

## 2024-04-14 VITALS — BP 130/78 | HR 65 | Temp 98.0°F | Ht 71.0 in | Wt 288.0 lb

## 2024-04-14 DIAGNOSIS — R7303 Prediabetes: Secondary | ICD-10-CM

## 2024-04-14 DIAGNOSIS — E559 Vitamin D deficiency, unspecified: Secondary | ICD-10-CM | POA: Diagnosis not present

## 2024-04-14 DIAGNOSIS — Z6841 Body Mass Index (BMI) 40.0 and over, adult: Secondary | ICD-10-CM | POA: Diagnosis not present

## 2024-04-14 MED ORDER — VITAMIN D (ERGOCALCIFEROL) 1.25 MG (50000 UNIT) PO CAPS: 50000.0000 [IU] | ORAL_CAPSULE | ORAL | 0 refills | Status: DC

## 2024-04-14 NOTE — Progress Notes (Signed)
 WEIGHT SUMMARY AND BIOMETRICS  Weight Lost Since Last Visit: 4lb  Weight Gained Since Last Visit: 0   Vitals Temp: 98 F (36.7 C) BP: 130/78 Pulse Rate: 65 SpO2: 97 %   Anthropometric Measurements Height: 5\' 11"  (1.803 m) Weight: 288 lb (130.6 kg) BMI (Calculated): 40.19 Weight at Last Visit: 292lb Weight Lost Since Last Visit: 4lb Weight Gained Since Last Visit: 0 Starting Weight: 304lb Total Weight Loss (lbs): 16 lb (7.258 kg) Peak Weight: 301lb   Body Composition  Body Fat %: 37.2 % Fat Mass (lbs): 107.4 lbs Muscle Mass (lbs): 172.6 lbs Total Body Water (lbs): 123.4 lbs Visceral Fat Rating : 23   Other Clinical Data Fasting: no Labs: no Today's Visit #: 3 Starting Date: 03/15/24    OBESITY Ricardo Clark is here to discuss his progress with his obesity treatment plan along with follow-up of his obesity related diagnoses.    Nutrition Plan: keeping a food journal with goal of 2,000-2,200 calories and 120-150 grams of protein daily - 95% adherence.  Current exercise: walking  Interim History:  He is down 4 pounds since his last visit Eating all of the food on the plan., Protein intake is as prescribed, Is not skipping meals, Water intake is adequate., and Denies polyphagia  Hunger is moderately controlled.  Cravings are moderately controlled.  Assessment/Plan:   Vitamin D  Deficiency Vitamin D  is not at goal of 50.  Most recent vitamin D  level was 19.3. He is on  prescription ergocalciferol  50,000 IU weekly. Lab Results  Component Value Date   VD25OH 19.3 (L) 03/15/2024    Plan: Refill prescription vitamin D  50,000 IU weekly.   Prediabetes Last A1c was 5.3  Medication(s): none He is sleeping better.  Lab Results  Component Value Date   HGBA1C 5.3 12/26/2023   HGBA1C 5.1 10/20/2019   HGBA1C 5.7 (A) 09/15/2018   HGBA1C 5.1 09/05/2017    Lab Results  Component Value Date   INSULIN  36.9 (H) 03/15/2024    Plan: Will minimize all refined carbohydrates both sweets and starches.  Will work on the plan and exercise.  Consider both aerobic and resistance training.  Will keep protein, water, and fiber intake high.  Increase Polyunsaturated and Monounsaturated fats to increase satiety and encourage weight loss.  Aim for 7 to 9 hours of sleep nightly.  Given the following information sheets and discussed #1 healthy fruit sheet.  #2 lunch specials, #3 protein sources. He will continue to read labels on a regular basis.    Morbid Obesity: Current BMI BMI (Calculated): 40.19    Ricardo Clark is currently in the action stage of change. As such, his goal is to continue with weight loss efforts.  He has agreed to keeping a food journal with goal of 2,000 to 2,200 calories and 150 grams of protein daily.  Exercise goals: For substantial health benefits, adults should  do at least 150 minutes (2 hours and 30 minutes) a week of moderate-intensity, or 75 minutes (1 hour and 15 minutes) a week of vigorous-intensity aerobic physical activity, or an equivalent combination of moderate- and vigorous-intensity aerobic activity. Aerobic activity should be performed in episodes of at least 10 minutes, and preferably, it should be spread throughout the week.  He is walking approximately 8000 steps a day and will continue.  Behavioral modification strategies: increasing lean protein intake, no meal skipping, meal planning , increase water intake, better snacking choices, increasing vegetables, increasing lower sugar fruits, and increasing fiber rich foods.  Ricardo Clark has agreed to follow-up with our clinic in 2 weeks.     Objective:   VITALS: Per patient if applicable, see vitals. GENERAL: Alert and in no acute distress. CARDIOPULMONARY: No increased WOB. Speaking in clear sentences.  PSYCH: Pleasant and cooperative. Speech normal rate and rhythm. Affect  is appropriate. Insight and judgement are appropriate. Attention is focused, linear, and appropriate.  NEURO: Oriented as arrived to appointment on time with no prompting.   Attestation Statements:   This was prepared with the assistance of Engineer, civil (consulting).  Occasional wrong-word or sound-a-like substitutions may have occurred due to the inherent limitations of voice recognition   Kirk Peper, DO

## 2024-04-28 ENCOUNTER — Ambulatory Visit (INDEPENDENT_AMBULATORY_CARE_PROVIDER_SITE_OTHER): Admitting: Bariatrics

## 2024-04-28 ENCOUNTER — Encounter: Payer: Self-pay | Admitting: Bariatrics

## 2024-04-28 VITALS — BP 104/66 | HR 65 | Temp 98.8°F | Ht 71.0 in | Wt 283.0 lb

## 2024-04-28 DIAGNOSIS — R7303 Prediabetes: Secondary | ICD-10-CM

## 2024-04-28 DIAGNOSIS — Z6839 Body mass index (BMI) 39.0-39.9, adult: Secondary | ICD-10-CM | POA: Diagnosis not present

## 2024-04-28 DIAGNOSIS — E669 Obesity, unspecified: Secondary | ICD-10-CM | POA: Diagnosis not present

## 2024-04-28 DIAGNOSIS — E66812 Obesity, class 2: Secondary | ICD-10-CM

## 2024-04-28 DIAGNOSIS — E559 Vitamin D deficiency, unspecified: Secondary | ICD-10-CM

## 2024-04-28 MED ORDER — VITAMIN D (ERGOCALCIFEROL) 1.25 MG (50000 UNIT) PO CAPS: 50000.0000 [IU] | ORAL_CAPSULE | ORAL | 0 refills | Status: DC

## 2024-04-28 NOTE — Progress Notes (Signed)
 WEIGHT SUMMARY AND BIOMETRICS  Weight Lost Since Last Visit: 5lb  Weight Gained Since Last Visit: 0   Vitals Temp: 98.8 F (37.1 C) BP: 104/66 Pulse Rate: 65 SpO2: 97 %   Anthropometric Measurements Height: 5\' 11"  (1.803 m) Weight: 283 lb (128.4 kg) BMI (Calculated): 39.49 Weight at Last Visit: 288lb Weight Lost Since Last Visit: 5lb Weight Gained Since Last Visit: 0 Starting Weight: 304lb Total Weight Loss (lbs): 21 lb (9.526 kg) Peak Weight: 301lb   Body Composition  Body Fat %: 37 % Fat Mass (lbs): 105 lbs Muscle Mass (lbs): 169.8 lbs Total Body Water (lbs): 123.4 lbs Visceral Fat Rating : 22   Other Clinical Data Fasting: no Labs: no Today's Visit #: 4 Starting Date: 03/15/24    OBESITY Ricardo Clark is here to discuss his progress with his obesity treatment plan along with follow-up of his obesity related diagnoses.    Nutrition Plan: keeping a food journal with goal of 2,000-2,200 calories and 120-150 grams of protein daily - 90-95% adherence.  Current exercise: walking  Interim History:  He is down 5 pounds since his last visit. His sleep is interrupted because of hip pain. He plans hip surgery in the future.  Eating all of the food on the plan., Protein intake is as prescribed, Is not skipping meals, Journaling consistently., and Water intake is adequate.  Hunger is moderately controlled.  Cravings are moderately controlled.  Assessment/Plan:   Vitamin D  Deficiency Vitamin D  is not at goal of 50.  Most recent vitamin D  level was 19.3. He is on  prescription ergocalciferol  50,000 IU weekly. Lab Results  Component Value Date   VD25OH 19.3 (L) 03/15/2024    Plan: Refill prescription vitamin D  50,000 IU weekly.   Prediabetes Last A1c was 5.3  Medication(s): none Lab Results  Component Value Date   HGBA1C 5.3 12/26/2023   HGBA1C 5.1  10/20/2019   HGBA1C 5.7 (A) 09/15/2018   HGBA1C 5.1 09/05/2017   Lab Results  Component Value Date   INSULIN  36.9 (H) 03/15/2024    Plan: Will minimize all refined carbohydrates both sweets and starches.  Will work on the plan and exercise.  Consider both aerobic and resistance training.  Will keep protein, water, and fiber intake high.  Increase Polyunsaturated and Monounsaturated fats to increase satiety and encourage weight loss.  Aim for 7 to 9 hours of sleep nightly.  Lunch options reviewed and homemade seasonings.    Generalized Obesity: Current BMI BMI (Calculated): 39.49    Ricardo Clark is currently in the action stage of change. As such, his goal is to continue with weight loss efforts.  He has agreed to keeping a food journal with goal of 2,000 to 2,200 calories and 120 to 150 grams of protein daily.  Exercise goals: All adults should avoid inactivity. Some physical activity is better than none, and adults who  participate in any amount of physical activity gain some health benefits.  Behavioral modification strategies: increasing lean protein intake, no meal skipping, meal planning , increase water intake, better snacking choices, planning for success, and increasing vegetables.  Ricardo Clark has agreed to follow-up with our clinic in 4 weeks.    Objective:   VITALS: Per patient if applicable, see vitals. GENERAL: Alert and in no acute distress. CARDIOPULMONARY: No increased WOB. Speaking in clear sentences.  PSYCH: Pleasant and cooperative. Speech normal rate and rhythm. Affect is appropriate. Insight and judgement are appropriate. Attention is focused, linear, and appropriate.  NEURO: Oriented as arrived to appointment on time with no prompting.   Attestation Statements:   This was prepared with the assistance of Engineer, civil (consulting).  Occasional wrong-word or sound-a-like substitutions may have occurred due to the inherent limitations of voice recognition   Kirk Peper,  DO

## 2024-05-19 ENCOUNTER — Encounter: Payer: Self-pay | Admitting: Bariatrics

## 2024-05-19 ENCOUNTER — Ambulatory Visit (INDEPENDENT_AMBULATORY_CARE_PROVIDER_SITE_OTHER): Admitting: Bariatrics

## 2024-05-19 VITALS — BP 119/80 | HR 59 | Temp 97.9°F | Ht 71.0 in | Wt 278.0 lb

## 2024-05-19 DIAGNOSIS — E559 Vitamin D deficiency, unspecified: Secondary | ICD-10-CM

## 2024-05-19 DIAGNOSIS — R7303 Prediabetes: Secondary | ICD-10-CM

## 2024-05-19 DIAGNOSIS — Z6838 Body mass index (BMI) 38.0-38.9, adult: Secondary | ICD-10-CM | POA: Diagnosis not present

## 2024-05-19 MED ORDER — VITAMIN D (ERGOCALCIFEROL) 1.25 MG (50000 UNIT) PO CAPS: 50000.0000 [IU] | ORAL_CAPSULE | ORAL | 0 refills | Status: DC

## 2024-05-19 NOTE — Progress Notes (Signed)
 WEIGHT SUMMARY AND BIOMETRICS  Weight Lost Since Last Visit: 5lb  Weight Gained Since Last Visit: 0   Vitals Temp: 97.9 F (36.6 C) BP: 119/80 Pulse Rate: (!) 59 SpO2: 94 %   Anthropometric Measurements Height: 5' 11 (1.803 m) Weight: 278 lb (126.1 kg) BMI (Calculated): 38.79 Weight at Last Visit: 283lb Weight Lost Since Last Visit: 5lb Weight Gained Since Last Visit: 0 Starting Weight: 304lb Total Weight Loss (lbs): 26 lb (11.8 kg) Peak Weight: 301lb   Body Composition  Body Fat %: 36.9 % Fat Mass (lbs): 102.6 lbs Muscle Mass (lbs): 166.8 lbs Total Body Water (lbs): 119 lbs Visceral Fat Rating : 22   Other Clinical Data Fasting: no Labs: no Today's Visit #: 5 Starting Date: 03/15/24    OBESITY Hadrian is here to discuss his progress with his obesity treatment plan along with follow-up of his obesity related diagnoses.    Nutrition Plan: keeping a food journal with goal of 2,000-2,200 calories and 120-150 grams of protein daily - 90% adherence.  Current exercise: walking  Interim History: He is down 5 lbs since her last visit.  Eating all of the food on the plan., Is not skipping meals, Water intake is adequate., and Denies polyphagia  Hunger is moderately controlled.  Cravings are moderately controlled.  Assessment/Plan:   Vitamin D  Deficiency Vitamin D  is at goal of 50.  Most recent vitamin D  level was 19.3. He is on  prescription ergocalciferol  50,000 IU weekly. Lab Results  Component Value Date   VD25OH 19.3 (L) 03/15/2024    Plan: Refill prescription vitamin D  50,000 IU weekly.   Prediabetes Last A1c was 5.3  Medication(s): none  Lab Results  Component Value Date   HGBA1C 5.3 12/26/2023   HGBA1C 5.1 10/20/2019   HGBA1C 5.7 (A) 09/15/2018   HGBA1C 5.1 09/05/2017   Lab Results  Component Value Date   INSULIN  36.9 (H)  03/15/2024    Plan: Will minimize all refined carbohydrates both sweets and starches.  Will work on the plan and exercise.  Consider both aerobic and resistance training.  Will keep protein, water, and fiber intake high.  Increase Polyunsaturated and Monounsaturated fats to increase satiety and encourage weight loss.  He will continue to pack his lunch daily.  Will continue his 8000 steps daily.   Generalized Obesity: Current BMI BMI (Calculated): 38.79    Saahas is currently in the action stage of change. As such, his goal is to continue with weight loss efforts.  He has agreed to keeping a food journal with goal of 2,000 to 2,200 calories and 120 to 150  grams of protein daily.  Exercise goals: All adults should avoid inactivity. Some physical activity is better than none, and adults who participate in any amount of physical activity gain some health benefits.  He is very active at work and does at  least 8000 steps daily.   Behavioral modification strategies: increasing lean protein intake, meal planning , increase water intake, better snacking choices, planning for success, increasing vegetables, decrease snacking , avoiding temptations, keep healthy foods in the home, weigh protein portions, and mindful eating.  Jay has agreed to follow-up with our clinic in 4 weeks.    Objective:   VITALS: Per patient if applicable, see vitals. GENERAL: Alert and in no acute distress. CARDIOPULMONARY: No increased WOB. Speaking in clear sentences.  PSYCH: Pleasant and cooperative. Speech normal rate and rhythm. Affect is appropriate. Insight and judgement are appropriate. Attention is focused, linear, and appropriate.  NEURO: Oriented as arrived to appointment on time with no prompting.   Attestation Statements:   This was prepared with the assistance of Engineer, civil (consulting).  Occasional wrong-word or sound-a-like substitutions may have occurred due to the inherent limitations of voice  recognition   Kirk Peper, DO

## 2024-06-16 ENCOUNTER — Ambulatory Visit (INDEPENDENT_AMBULATORY_CARE_PROVIDER_SITE_OTHER): Admitting: Bariatrics

## 2024-06-16 ENCOUNTER — Encounter: Payer: Self-pay | Admitting: Bariatrics

## 2024-06-16 VITALS — BP 117/76 | HR 62 | Temp 98.4°F | Ht 71.0 in | Wt 269.0 lb

## 2024-06-16 DIAGNOSIS — E785 Hyperlipidemia, unspecified: Secondary | ICD-10-CM | POA: Diagnosis not present

## 2024-06-16 DIAGNOSIS — E559 Vitamin D deficiency, unspecified: Secondary | ICD-10-CM

## 2024-06-16 DIAGNOSIS — Z6837 Body mass index (BMI) 37.0-37.9, adult: Secondary | ICD-10-CM

## 2024-06-16 DIAGNOSIS — E7849 Other hyperlipidemia: Secondary | ICD-10-CM

## 2024-06-16 DIAGNOSIS — E669 Obesity, unspecified: Secondary | ICD-10-CM

## 2024-06-16 MED ORDER — VITAMIN D (ERGOCALCIFEROL) 1.25 MG (50000 UNIT) PO CAPS: 50000.0000 [IU] | ORAL_CAPSULE | ORAL | 0 refills | Status: DC

## 2024-06-16 NOTE — Progress Notes (Signed)
 WEIGHT SUMMARY AND BIOMETRICS  Weight Lost Since Last Visit: 9lb  Weight Gained Since Last Visit: 0   Vitals Temp: 98.4 F (36.9 C) BP: 117/76 Pulse Rate: 62 SpO2: 96 %   Anthropometric Measurements Height: 5' 11 (1.803 m) Weight: 269 lb (122 kg) BMI (Calculated): 37.53 Weight at Last Visit: 278lb Weight Lost Since Last Visit: 9lb Weight Gained Since Last Visit: 0 Starting Weight: 304lb Total Weight Loss (lbs): 35 lb (15.9 kg) Peak Weight: 301lb   Body Composition  Body Fat %: 35.4 % Fat Mass (lbs): 95.4 lbs Muscle Mass (lbs): 165.6 lbs Total Body Water (lbs): 115.8 lbs Visceral Fat Rating : 20   Other Clinical Data Fasting: yes Labs: no Today's Visit #: 6 Starting Date: 03/15/24    OBESITY Ricardo Clark is here to discuss his progress with his obesity treatment plan along with follow-up of his obesity related diagnoses.    Nutrition Plan: keeping a food journal with goal of 2000-2200 calories and 120-150 grams of protein daily - 80% adherence.  Current exercise: walking  Interim History:  He is down 9 lbs since his last visit.  He states that he has been walking more and also has started to do some resistance training.  He denies eating between meals which is a change for him.  He states that he is drinking more water and making better choices overall. Eating all of the food on the plan., Protein intake is as prescribed, Is not skipping meals, Water intake is adequate., and Reports polyphagia  Hunger is moderately controlled.  Cravings are moderately controlled.  Assessment/Plan:   Vitamin D  Deficiency Vitamin D  is not at goal of 50.  Most recent vitamin D  level was 19.3. He is on  prescription ergocalciferol  50,000 IU weekly. Lab Results  Component Value Date   VD25OH 19.3 (L) 03/15/2024    Plan: Continue prescription vitamin D  50,000 IU  weekly.  He will get some sun exposure.  Hyperlipidemia LDL is not at goal. Medication(s): no medications Cardiovascular risk factors: dyslipidemia, male gender, obesity (BMI >= 30 kg/m2), and sedentary lifestyle  Lab Results  Component Value Date   CHOL 191 12/26/2023   HDL 39.20 12/26/2023   LDLCALC 127 (H) 12/26/2023   TRIG 126.0 12/26/2023   CHOLHDL 5 12/26/2023   Lab Results  Component Value Date   ALT 24 03/15/2024   AST 21 03/15/2024   ALKPHOS 106 03/15/2024   BILITOT 0.5 03/15/2024   The 10-year ASCVD risk score (Arnett DK, et al., 2019) is: 4.7%   Values used to calculate the score:     Age: 53 years     Clincally relevant sex: Male     Is Non-Hispanic African American: No     Diabetic: No     Tobacco smoker: No     Systolic Blood Pressure: 117 mmHg     Is  BP treated: No     HDL Cholesterol: 39.2 mg/dL     Total Cholesterol: 191 mg/dL  Plan:  He is going to continue both resistance and cardio exercise on a regular basis at least 3 days/week. He will keep his protein and water high. Will begin a probiotic for man to help with constipation and increase fiber and water. Information given in regard to healthier crackers that he can eat with his tuna for his snack. Information sheet on healthy carb and protein choices. Will avoid all trans fats.  Will read labels   Generalized Obesity: Current BMI BMI (Calculated): 37.53   [name] is currently in the action stage of change. As such, his goal is to continue with weight loss efforts.  He has agreed to keeping a food journal with goal of 2,000 to 2,200 calories and 120 to 150  grams of protein daily.  Exercise goals: All adults should avoid inactivity. Some physical activity is better than none, and adults who participate in any amount of physical activity gain some health benefits.  Behavioral modification strategies: increasing lean protein intake, no meal skipping, meal planning , increase water intake, better  snacking choices, planning for success, increasing vegetables, avoiding temptations, keep healthy foods in the home, increase frequency of journaling, weigh protein portions, and mindful eating.  [name] has agreed to follow-up with our clinic in 4 weeks.    Objective:   VITALS: Per patient if applicable, see vitals. GENERAL: Alert and in no acute distress. CARDIOPULMONARY: No increased WOB. Speaking in clear sentences.  PSYCH: Pleasant and cooperative. Speech normal rate and rhythm. Affect is appropriate. Insight and judgement are appropriate. Attention is focused, linear, and appropriate.  NEURO: Oriented as arrived to appointment on time with no prompting.   Attestation Statements:   This was prepared with the assistance of Engineer, civil (consulting).  Occasional wrong-word or sound-a-like substitutions may have occurred due to the inherent limitations of voice recognition   Ricardo Daring, DO

## 2024-07-15 ENCOUNTER — Ambulatory Visit (INDEPENDENT_AMBULATORY_CARE_PROVIDER_SITE_OTHER): Admitting: Bariatrics

## 2024-07-15 ENCOUNTER — Encounter: Payer: Self-pay | Admitting: Bariatrics

## 2024-07-15 VITALS — BP 116/74 | HR 57 | Temp 98.1°F | Ht 71.0 in | Wt 264.0 lb

## 2024-07-15 DIAGNOSIS — E88819 Insulin resistance, unspecified: Secondary | ICD-10-CM

## 2024-07-15 DIAGNOSIS — R0602 Shortness of breath: Secondary | ICD-10-CM

## 2024-07-15 DIAGNOSIS — E559 Vitamin D deficiency, unspecified: Secondary | ICD-10-CM | POA: Diagnosis not present

## 2024-07-15 DIAGNOSIS — R5383 Other fatigue: Secondary | ICD-10-CM | POA: Diagnosis not present

## 2024-07-15 DIAGNOSIS — Z0000 Encounter for general adult medical examination without abnormal findings: Secondary | ICD-10-CM | POA: Diagnosis not present

## 2024-07-15 DIAGNOSIS — E669 Obesity, unspecified: Secondary | ICD-10-CM

## 2024-07-15 DIAGNOSIS — Z6836 Body mass index (BMI) 36.0-36.9, adult: Secondary | ICD-10-CM

## 2024-07-15 NOTE — Progress Notes (Signed)
 WEIGHT SUMMARY AND BIOMETRICS  Weight Lost Since Last Visit: 5lb  Weight Gained Since Last Visit: 0   Vitals Temp: 98.1 F (36.7 C) BP: 116/74 Pulse Rate: (!) 57 SpO2: 98 %   Anthropometric Measurements Height: 5' 11 (1.803 m) Weight: 264 lb (119.7 kg) BMI (Calculated): 36.84 Weight at Last Visit: 269lb Weight Lost Since Last Visit: 5lb Weight Gained Since Last Visit: 0 Starting Weight: 304lb Total Weight Loss (lbs): 40 lb (18.1 kg) Peak Weight: 301lb   Body Composition  Body Fat %: 34.8 % Fat Mass (lbs): 92 lbs Muscle Mass (lbs): 163.8 lbs Total Body Water (lbs): 116.2 lbs Visceral Fat Rating : 20   Other Clinical Data Fasting: yes Labs: yes Today's Visit #: 7 Starting Date: 03/15/24    OBESITY Ricardo Clark is here to discuss his progress with his obesity treatment plan along with follow-up of his obesity related diagnoses.    Nutrition Plan: keeping a food journal with goal of 2000-2200 calories and 130-150 grams of protein daily - 90% adherence.  Current exercise: walking  Interim History:  He  is down 5 pounds since her last visit Eating all of the food on the plan., Is not skipping meals, Journaling consistently., and Denies polyphagia   Pharmacotherapy: He is not on any antiobesity medications. Hunger is moderately controlled.  Cravings are moderately controlled.  Assessment/Plan:   Vitamin D  Deficiency Vitamin D  is not at goal of 50.  Most recent vitamin D  level was 19.3. He is on  prescription ergocalciferol  50,000 IU weekly. Lab Results  Component Value Date   VD25OH 19.3 (L) 03/15/2024    Plan: Refill prescription vitamin D  50,000 IU weekly.   Insulin  Resistance Ricardo Clark has had elevated fasting insulin  readings. Goal is HgbA1c < 5.7, fasting insulin  at l0 or less, and preferably at 5.  He denies polyphagia. Medication(s):  none Lab Results  Component Value Date   HGBA1C 5.3 12/26/2023   Lab Results  Component Value Date   INSULIN  36.9 (H) 03/15/2024    Plan Medication(s): none Will work on the agreed upon plan. Will minimize refined carbohydrates ( sweets and starches), and focus more on complex carbohydrates.  Increase the micronutrients found in leafy greens, which include magnesium, polyphenols, and vitamin C which have been postulated to help with insulin  sensitivity. Minimize fast food and cook more meals at home.  Increase fiber to 25 to 30 grams daily.  Will continue to do meal planning. He will add some light weights to his regimen for muscle building.   Fatigue and SOB:   Had an indirect calorimetry test that showed the following: Actual RMR - 2074 kcal   Expected RMR-  2363  kcal.  He is previous RMR was 2736 on 03/15/2024. Actual RMR is < than Expected RMR.       Read the RMR findings and discussed the ramifications of  the RMR reading  with the patient and allowed to ask questions. Voiced understanding.   Plan:  Will continue the current plan. Will make the following changes in his plan: add to 10 to 20 grams of protein to his current plan.  Will increase exercise to the following: Will add in weights approximately for 20 minutes 3 times per week.  Health Maintenance:   Obesity   Plan: Will do  indirect calorimetry, and labs.       Generalized Obesity: Current BMI BMI (Calculated): 36.84   Pharmacotherapy Plan No antiobesity medications at this time.  Ricardo Clark is currently in the action stage of change. As such, his goal is to continue with weight loss efforts.  He has agreed to keeping a food journal with goal of 2,000 to 2,200 calories and 100 to 120 grams of protein daily.  Exercise goals: For substantial health benefits, adults should do at least 150 minutes (2 hours and 30 minutes) a week of moderate-intensity, or 75 minutes (1 hour and 15 minutes) a week of  vigorous-intensity aerobic physical activity, or an equivalent combination of moderate- and vigorous-intensity aerobic activity. Aerobic activity should be performed in episodes of at least 10 minutes, and preferably, it should be spread throughout the week. He will add in hand weights on a regular basis.  Behavioral modification strategies: increasing lean protein intake, no meal skipping, meal planning , increase water intake, better snacking choices, planning for success, increasing vegetables, increasing fiber rich foods, avoiding temptations, and weigh protein portions.  Ricardo Clark has agreed to follow-up with our clinic in 4 weeks.   Labs done today (CMP, insulin , vitamin D ).     Objective:   VITALS: Per patient if applicable, see vitals. GENERAL: Alert and in no acute distress. CARDIOPULMONARY: No increased WOB. Speaking in clear sentences.  PSYCH: Pleasant and cooperative. Speech normal rate and rhythm. Affect is appropriate. Insight and judgement are appropriate. Attention is focused, linear, and appropriate.  NEURO: Oriented as arrived to appointment on time with no prompting.   Attestation Statements:   This was prepared with the assistance of Engineer, civil (consulting).  Occasional wrong-word or sound-a-like substitutions may have occurred due to the inherent limitations of voice recognition

## 2024-07-17 LAB — COMPREHENSIVE METABOLIC PANEL WITH GFR
ALT: 14 IU/L (ref 0–44)
AST: 15 IU/L (ref 0–40)
Albumin: 4.3 g/dL (ref 3.8–4.9)
Alkaline Phosphatase: 92 IU/L (ref 44–121)
BUN/Creatinine Ratio: 18 (ref 9–20)
BUN: 15 mg/dL (ref 6–24)
Bilirubin Total: 0.8 mg/dL (ref 0.0–1.2)
CO2: 22 mmol/L (ref 20–29)
Calcium: 10.6 mg/dL — ABNORMAL HIGH (ref 8.7–10.2)
Chloride: 103 mmol/L (ref 96–106)
Creatinine, Ser: 0.82 mg/dL (ref 0.76–1.27)
Globulin, Total: 3.3 g/dL (ref 1.5–4.5)
Glucose: 90 mg/dL (ref 70–99)
Potassium: 4.8 mmol/L (ref 3.5–5.2)
Sodium: 139 mmol/L (ref 134–144)
Total Protein: 7.6 g/dL (ref 6.0–8.5)
eGFR: 105 mL/min/1.73 (ref >59)

## 2024-07-17 LAB — INSULIN, RANDOM: INSULIN: 15 u[IU]/mL (ref 2.6–24.9)

## 2024-07-17 LAB — VITAMIN D 25 HYDROXY (VIT D DEFICIENCY, FRACTURES): Vit D, 25-Hydroxy: 57.7 ng/mL (ref 30.0–100.0)

## 2024-07-19 ENCOUNTER — Ambulatory Visit: Payer: Self-pay

## 2024-07-19 NOTE — Telephone Encounter (Signed)
Notified patient of Dr. Browns recommendations. Patient verbalized understanding.

## 2024-07-19 NOTE — Telephone Encounter (Signed)
-----   Message from Clayborne Daring sent at 07/19/2024  1:21 PM EDT ----- Call pt. Her calcium is up slightly. No alarm at this time. We can recheck in 3 months. Stop calcium if taking.

## 2024-08-12 ENCOUNTER — Ambulatory Visit (INDEPENDENT_AMBULATORY_CARE_PROVIDER_SITE_OTHER): Admitting: Bariatrics

## 2024-08-12 ENCOUNTER — Encounter: Payer: Self-pay | Admitting: Bariatrics

## 2024-08-12 VITALS — BP 119/79 | HR 61 | Temp 98.7°F | Ht 71.0 in | Wt 261.0 lb

## 2024-08-12 DIAGNOSIS — E669 Obesity, unspecified: Secondary | ICD-10-CM

## 2024-08-12 DIAGNOSIS — E559 Vitamin D deficiency, unspecified: Secondary | ICD-10-CM

## 2024-08-12 DIAGNOSIS — E785 Hyperlipidemia, unspecified: Secondary | ICD-10-CM

## 2024-08-12 DIAGNOSIS — Z6836 Body mass index (BMI) 36.0-36.9, adult: Secondary | ICD-10-CM | POA: Diagnosis not present

## 2024-08-12 DIAGNOSIS — E7849 Other hyperlipidemia: Secondary | ICD-10-CM

## 2024-08-12 MED ORDER — VITAMIN D (ERGOCALCIFEROL) 1.25 MG (50000 UNIT) PO CAPS: 50000.0000 [IU] | ORAL_CAPSULE | ORAL | 0 refills | Status: DC

## 2024-08-12 NOTE — Progress Notes (Signed)
 WEIGHT SUMMARY AND BIOMETRICS  Weight Lost Since Last Visit: 3lb  Weight Gained Since Last Visit: 0   Vitals Temp: 98.7 F (37.1 C) BP: 119/79 Pulse Rate: 61 SpO2: 97 %   Anthropometric Measurements Height: 5' 11 (1.803 m) Weight: 261 lb (118.4 kg) BMI (Calculated): 36.42 Weight at Last Visit: 264lb Weight Lost Since Last Visit: 3lb Weight Gained Since Last Visit: 0 Starting Weight: 304lb Total Weight Loss (lbs): 43 lb (19.5 kg) Peak Weight: 301lb   Body Composition  Body Fat %: 34.4 % Fat Mass (lbs): 89.8 lbs Muscle Mass (lbs): 162.8 lbs Total Body Water (lbs): 114.2 lbs Visceral Fat Rating : 19   Other Clinical Data Fasting: no Labs: no Today's Visit #: 8 Starting Date: 03/15/24    OBESITY Ricardo Clark is here to discuss his progress with his obesity treatment plan along with follow-up of his obesity related diagnoses.    Nutrition Plan: keeping a food journal with goal of 2000-2200 calories and 130-150 grams of protein daily - 80% adherence.  Current exercise: walking and resistance training.   Interim History:  He is down 1 lb since his last visit.  Eating all of the food on the plan., Protein intake is as prescribed, Is not skipping meals, and Water intake is adequate.  Hunger is moderately controlled.  Cravings are moderately controlled.  Assessment/Plan:   Vitamin D  Deficiency Vitamin D  is at goal of 50.  Most recent vitamin D  level was 57.7. He is on  prescription ergocalciferol  50,000 IU weekly. Lab Results  Component Value Date   VD25OH 57.7 07/15/2024   VD25OH 19.3 (L) 03/15/2024    Plan: Refill prescription vitamin D  50,000 IU weekly.   Hyperlipidemia LDL is not at goal. Medication(s): no medication  Cardiovascular risk factors: dyslipidemia, obesity (BMI >= 30 kg/m2), and sedentary lifestyle  Lab Results  Component Value  Date   CHOL 191 12/26/2023   HDL 39.20 12/26/2023   LDLCALC 127 (H) 12/26/2023   TRIG 126.0 12/26/2023   CHOLHDL 5 12/26/2023   Lab Results  Component Value Date   ALT 14 07/15/2024   AST 15 07/15/2024   ALKPHOS 92 07/15/2024   BILITOT 0.8 07/15/2024   The 10-year ASCVD risk score (Arnett DK, et al., 2019) is: 4.9%   Values used to calculate the score:     Age: 53 years     Clincally relevant sex: Male     Is Non-Hispanic African American: No     Diabetic: No     Tobacco smoker: No     Systolic Blood Pressure: 119 mmHg     Is BP treated: No     HDL Cholesterol: 39.2 mg/dL     Total Cholesterol: 191 mg/dL  Plan:  Will avoid all trans fats.  Will read labels Will minimize saturated fats except the following: low fat meats in moderation, diary,  and limited dark chocolate.  Increase Omega 3 in foods, and consider an Omega 3 supplement.  He will continue his exercise both cardio and resistance. He will work to maintain his muscle as he loses weight.  Labs reviewed today from 07/15/24 (CMP, glucose, globulin, insulin , vitamin D ).      Generalized Obesity: Current BMI BMI (Calculated): 36.42    Ricardo Clark is currently in the action stage of change. As such, his goal is to continue with weight loss efforts.  He has agreed to keeping a food journal with goal of 2,000 to 2,200  calories and 130 to 150  grams of protein daily.  Exercise goals: For substantial health benefits, adults should do at least 150 minutes (2 hours and 30 minutes) a week of moderate-intensity, or 75 minutes (1 hour and 15 minutes) a week of vigorous-intensity aerobic physical activity, or an equivalent combination of moderate- and vigorous-intensity aerobic activity. Aerobic activity should be performed in episodes of at least 10 minutes, and preferably, it should be spread throughout the week.  Behavioral modification strategies: increasing lean protein intake, decreasing simple carbohydrates , no meal skipping,  decrease eating out, meal planning , increase water intake, better snacking choices, planning for success, increasing vegetables, avoiding temptations, keep healthy foods in the home, increase frequency of journaling, and mindful eating.  Ricardo Clark has agreed to follow-up with our clinic in 4 weeks.      Objective:   VITALS: Per patient if applicable, see vitals. GENERAL: Alert and in no acute distress. CARDIOPULMONARY: No increased WOB. Speaking in clear sentences.  PSYCH: Pleasant and cooperative. Speech normal rate and rhythm. Affect is appropriate. Insight and judgement are appropriate. Attention is focused, linear, and appropriate.  NEURO: Oriented as arrived to appointment on time with no prompting.   Attestation Statements:   This was prepared with the assistance of Engineer, civil (consulting).  Occasional wrong-word or sound-a-like substitutions may have occurred due to the inherent limitations of voice recognition   Clayborne Daring, DO

## 2024-09-13 ENCOUNTER — Ambulatory Visit (INDEPENDENT_AMBULATORY_CARE_PROVIDER_SITE_OTHER): Admitting: Bariatrics

## 2024-09-13 ENCOUNTER — Encounter: Payer: Self-pay | Admitting: Bariatrics

## 2024-09-13 VITALS — BP 115/71 | HR 68 | Temp 98.3°F | Ht 71.0 in | Wt 257.0 lb

## 2024-09-13 DIAGNOSIS — E669 Obesity, unspecified: Secondary | ICD-10-CM

## 2024-09-13 DIAGNOSIS — E88819 Insulin resistance, unspecified: Secondary | ICD-10-CM

## 2024-09-13 DIAGNOSIS — Z6835 Body mass index (BMI) 35.0-35.9, adult: Secondary | ICD-10-CM | POA: Diagnosis not present

## 2024-09-13 DIAGNOSIS — E559 Vitamin D deficiency, unspecified: Secondary | ICD-10-CM | POA: Diagnosis not present

## 2024-09-13 NOTE — Progress Notes (Signed)
 WEIGHT SUMMARY AND BIOMETRICS  Weight Lost Since Last Visit: 4lb  Weight Gained Since Last Visit: 0   Vitals Temp: 98.3 F (36.8 C) BP: 115/71 Pulse Rate: 68 SpO2: 97 %   Anthropometric Measurements Height: 5' 11 (1.803 m) Weight: 257 lb (116.6 kg) BMI (Calculated): 35.86 Weight at Last Visit: 261lb Weight Lost Since Last Visit: 4lb Weight Gained Since Last Visit: 0 Starting Weight: 304lb Total Weight Loss (lbs): 47 lb (21.3 kg) Peak Weight: 301lb   Body Composition  Body Fat %: 34.1 % Fat Mass (lbs): 87.8 lbs Muscle Mass (lbs): 161 lbs Total Body Water (lbs): 112.6 lbs Visceral Fat Rating : 19   Other Clinical Data Fasting: yes Labs: no Today's Visit #: 9 Starting Date: 03/15/24    OBESITY Caden is here to discuss his progress with his obesity treatment plan along with follow-up of his obesity related diagnoses.    Nutrition Plan: keeping a food journal with goal of 2000-2200 calories and 130-150 grams of protein daily - 80% adherence.  Current exercise: walking  Interim History:  He is down 4 lbs since his last visit. Eating all of the food on the plan., Protein intake is as prescribed, Is skipping meals, and Water intake is adequate.  Hunger is well controlled.  Cravings are well controlled.  Assessment/Plan:   Vitamin D  Deficiency Vitamin D  is at goal of 50.  Most recent vitamin D  level was 57.7. He is on  prescription ergocalciferol  50,000 IU every 14 days. Lab Results  Component Value Date   VD25OH 57.7 07/15/2024   VD25OH 19.3 (L) 03/15/2024    Plan: Continue prescription vitamin D  50,000 IU every 14 days.   Insulin  Resistance Mancel has had elevated fasting insulin  readings. Goal is HgbA1c < 5.7, fasting insulin  at l0 or less, and preferably at 5.  He denies polyphagia. Medication(s): none Lab Results  Component Value  Date   HGBA1C 5.3 12/26/2023   Lab Results  Component Value Date   INSULIN  15.0 07/15/2024   INSULIN  36.9 (H) 03/15/2024    Plan Will work on the agreed upon plan. Will minimize refined carbohydrates ( sweets and starches), and focus more on complex carbohydrates.  Increase the micronutrients found in leafy greens, which include magnesium, polyphenols, and vitamin C which have been postulated to help with insulin  sensitivity. Minimize fast food and cook more meals at home.  Increase fiber to 25 to 30 grams daily.  He will read labels.  He will make better choices.     Generalized Obesity: Current BMI BMI (Calculated): 35.86   Pharmacotherapy Plan No anti-obesity medications.  Edgard is currently in the action stage of change. As such, his goal is to continue with weight loss efforts.  He has agreed to keeping a food journal with goal of 2,000 to 2,200 calories and 130 to 150  grams of protein daily.  Exercise goals: All adults should avoid inactivity. Some physical activity is better than none, and adults who participate in any amount of physical activity gain some health benefits. He will get back to his walking and resistance training.   Behavioral modification strategies: increasing lean protein intake, decreasing simple carbohydrates , no meal skipping, meal planning , increase water intake, better snacking choices, planning for success, increasing vegetables, avoiding temptations, and keep healthy foods in the home.  Hanish has agreed to follow-up with our clinic in 4 weeks.    Objective:   VITALS: Per patient if applicable, see vitals. GENERAL: Alert and in no acute distress. CARDIOPULMONARY: No increased WOB. Speaking in clear sentences.  PSYCH: Pleasant and cooperative. Speech normal rate and rhythm. Affect is appropriate. Insight and judgement are appropriate. Attention is focused, linear, and appropriate.  NEURO: Oriented as arrived to appointment on time with no  prompting.   Attestation Statements:   This was prepared with the assistance of Engineer, civil (consulting).  Occasional wrong-word or sound-a-like substitutions may have occurred due to the inherent limitations of voice recognition    Clayborne Daring, DO

## 2024-09-24 ENCOUNTER — Other Ambulatory Visit: Payer: Self-pay | Admitting: Bariatrics

## 2024-10-18 ENCOUNTER — Ambulatory Visit (INDEPENDENT_AMBULATORY_CARE_PROVIDER_SITE_OTHER): Admitting: Bariatrics

## 2024-10-18 ENCOUNTER — Encounter: Payer: Self-pay | Admitting: Bariatrics

## 2024-10-18 VITALS — BP 119/76 | HR 62 | Ht 71.0 in | Wt 263.0 lb

## 2024-10-18 DIAGNOSIS — E669 Obesity, unspecified: Secondary | ICD-10-CM | POA: Diagnosis not present

## 2024-10-18 DIAGNOSIS — Z6836 Body mass index (BMI) 36.0-36.9, adult: Secondary | ICD-10-CM | POA: Diagnosis not present

## 2024-10-18 DIAGNOSIS — E88819 Insulin resistance, unspecified: Secondary | ICD-10-CM

## 2024-10-18 NOTE — Progress Notes (Signed)
 WEIGHT SUMMARY AND BIOMETRICS  Weight Lost Since Last Visit: 0lb  Weight Gained Since Last Visit: 6lb   Vitals BP: 119/76 Pulse Rate: 62 SpO2: 98 %   Anthropometric Measurements Height: 5' 11 (1.803 m) Weight: 263 lb (119.3 kg) BMI (Calculated): 36.7 Weight at Last Visit: 257lb Weight Lost Since Last Visit: 0lb Weight Gained Since Last Visit: 6lb Starting Weight: 304lb Total Weight Loss (lbs): 41 lb (18.6 kg) Peak Weight: 301lb   Body Composition  Body Fat %: 44.2 % Fat Mass (lbs): 116.2 lbs Muscle Mass (lbs): 139.4 lbs Total Body Water (lbs): 94 lbs Visceral Fat Rating : 12   Other Clinical Data Fasting: No Labs: No Today's Visit #: 10 Starting Date: 03/15/24    OBESITY Ricardo Clark is here to discuss his progress with his obesity treatment plan along with follow-up of his obesity related diagnoses.    Nutrition Plan: keeping a food journal with goal of 2200 calories and 130-150 grams of protein daily - 50% adherence.  Current exercise: none  Interim History:  [place] and has gained 6 lbs since his last visit. He is committed to getting back on track . He will resume his walking Eating all of the food on the plan., Protein intake is as prescribed, Is not skipping meals, Not journaling consistently., and Water intake is adequate.   Pharmacotherapy: Tavonte is not on any anti-obesity medications.  Hunger is moderately controlled.  Cravings are moderately controlled.  Assessment/Plan:   Insulin  Resistance Micha has had elevated fasting insulin  readings. Goal is HgbA1c < 5.7, fasting insulin  at l0 or less, and preferably at 5.  He reports polyphagia. Medication(s):none  Lab Results  Component Value Date   INSULIN  15.0 07/15/2024   INSULIN  36.9 (H) 03/15/2024    Plan Medication(s): none Will work on the agreed upon plan. Will  resume exercise.  Will eat more at home.  Minimize fast food and cook more meals at home.  Increase fiber to 25 to 30 grams daily.  Will drink at least 64 ounces of water.    Generalized Obesity: Current BMI BMI (Calculated): 36.7   Pharmacotherapy Plan  Charley is currently in the action stage of change. As such, his goal is to continue with weight loss efforts.  He has agreed to keeping a food journal with goal of 2000 to 2,200 calories and 130-150 grams of protein daily.  Exercise goals: All adults should avoid inactivity. Some physical activity is better than none, and adults who participate in any amount of physical activity gain some health benefits.  He will get back to his resistance and cardio.   Behavioral modification strategies: increasing lean protein intake, no meal skipping, meal planning , increase water intake, better snacking choices, planning for success, increasing vegetables, emotional eating strategies, avoiding temptations, keep healthy foods in the home, and mindful eating.  Gilberto has  agreed to follow-up with our clinic in 4 weeks.     Objective:   VITALS: Per patient if applicable, see vitals. GENERAL: Alert and in no acute distress. CARDIOPULMONARY: No increased WOB. Speaking in clear sentences.  PSYCH: Pleasant and cooperative. Speech normal rate and rhythm. Affect is appropriate. Insight and judgement are appropriate. Attention is focused, linear, and appropriate.  NEURO: Oriented as arrived to appointment on time with no prompting.   Attestation Statements:   This was prepared with the assistance of Engineer, Civil (consulting).  Occasional wrong-word or sound-a-like substitutions may have occurred due to the inherent limitations of voice recognition   Clayborne Daring, DO

## 2024-11-22 ENCOUNTER — Ambulatory Visit: Admitting: Bariatrics

## 2024-11-24 ENCOUNTER — Encounter: Payer: Self-pay | Admitting: Bariatrics

## 2024-11-24 ENCOUNTER — Ambulatory Visit: Admitting: Bariatrics

## 2024-11-24 VITALS — BP 116/76 | HR 73 | Ht 71.0 in | Wt 262.0 lb

## 2024-11-24 DIAGNOSIS — E559 Vitamin D deficiency, unspecified: Secondary | ICD-10-CM

## 2024-11-24 DIAGNOSIS — E669 Obesity, unspecified: Secondary | ICD-10-CM | POA: Diagnosis not present

## 2024-11-24 DIAGNOSIS — E88819 Insulin resistance, unspecified: Secondary | ICD-10-CM | POA: Diagnosis not present

## 2024-11-24 DIAGNOSIS — E66812 Obesity, class 2: Secondary | ICD-10-CM

## 2024-11-24 DIAGNOSIS — Z6836 Body mass index (BMI) 36.0-36.9, adult: Secondary | ICD-10-CM

## 2024-11-24 MED ORDER — VITAMIN D (ERGOCALCIFEROL) 1.25 MG (50000 UNIT) PO CAPS: 50000.0000 [IU] | ORAL_CAPSULE | ORAL | 0 refills | Status: DC

## 2024-11-24 NOTE — Progress Notes (Signed)
 WEIGHT SUMMARY AND BIOMETRICS  Weight Lost Since Last Visit: 1lb  Weight Gained Since Last Visit: 0   Vitals BP: 116/76 Pulse Rate: 73 SpO2: 99 %   Anthropometric Measurements Height: 5' 11 (1.803 m) Weight: 262 lb (118.8 kg) BMI (Calculated): 36.56 Weight at Last Visit: 263lb Weight Lost Since Last Visit: 1lb Weight Gained Since Last Visit: 0 Starting Weight: 304lb Total Weight Loss (lbs): 42 lb (19.1 kg) Peak Weight: 301lb   Body Composition  Body Fat %: 35.1 % Fat Mass (lbs): 92.2 lbs Muscle Mass (lbs): 162.2 lbs Total Body Water (lbs): 113.8 lbs Visceral Fat Rating : 20   Other Clinical Data Fasting: no Labs: no Today's Visit #: 11 Starting Date: 03/15/24    OBESITY Thermon is here to discuss his progress with his obesity treatment plan along with follow-up of his obesity related diagnoses.    Nutrition Plan: keeping a food journal with goal of 2200 calories and 130-150 grams of protein daily - 60% adherence.  Current exercise: walking  Interim History:  He is down 1 lb since his last visit.  Eating all of the food on the plan., Protein intake is as prescribed, Is not skipping meals, and Water intake is adequate.   Pharmacotherapy: Jeryl is not on any antiobesity medicine Hunger is moderately controlled.  Cravings are moderately controlled.  Assessment/Plan:   Vitamin D  Deficiency Vitamin D  is at goal of 50.  Most recent vitamin D  level was 57.7. He is on  prescription ergocalciferol  50,000 IU weekly. Lab Results  Component Value Date   VD25OH 57.7 07/15/2024   VD25OH 19.3 (L) 03/15/2024    Plan: Refill prescription vitamin D  50,000 IU weekly.   Insulin  Resistance Shameek has had elevated fasting insulin  readings. Goal is HgbA1c < 5.7, fasting insulin  at l0 or less, and preferably at 5.  He denies polyphagia except at dinner.   Medication(s): none Lab Results  Component Value Date   HGBA1C 5.3 12/26/2023   Lab Results  Component Value Date   INSULIN  15.0 07/15/2024   INSULIN  36.9 (H) 03/15/2024    Plan Medication(s): no medications Will minimize refined carbohydrates ( sweets and starches), and focus more on complex carbohydrates.  Increase the micronutrients found in leafy greens, which include magnesium, polyphenols, and vitamin C which have been postulated to help with insulin  sensitivity. Minimize fast food and cook more meals at home.  He will get back into his routine. He plans on doing more cooking at home and smoking his meat.    Generalized Obesity: Current BMI BMI (Calculated): 36.56   Pharmacotherapy Plan No medications.   Jibreel is currently in the action stage of change. As such, his goal is to continue with weight loss efforts.  He has agreed to keeping a food journal with goal of 2,200 calories and 130 to 150 grams of protein daily.  Exercise  goals: All adults should avoid inactivity. Some physical activity is better than none, and adults who participate in any amount of physical activity gain some health benefits. He will continue with the exercise bands.   Behavioral modification strategies: increasing lean protein intake, decreasing simple carbohydrates , no meal skipping, meal planning , better snacking choices, planning for success, increasing vegetables, increasing fiber rich foods, avoiding temptations, keep healthy foods in the home, weigh protein portions, measure portion sizes, and work on smaller portions.  Jaquane has agreed to follow-up with our clinic in 4 weeks.    Objective:   VITALS: Per patient if applicable, see vitals. GENERAL: Alert and in no acute distress. CARDIOPULMONARY: No increased WOB. Speaking in clear sentences.  PSYCH: Pleasant and cooperative. Speech normal rate and rhythm. Affect is appropriate. Insight and judgement are appropriate. Attention is  focused, linear, and appropriate.  NEURO: Oriented as arrived to appointment on time with no prompting.   Attestation Statements:   This was prepared with the assistance of Engineer, Civil (consulting).  Occasional wrong-word or sound-a-like substitutions may have occurred due to the inherent limitations of voice recognition.   Clayborne Daring, DO

## 2024-12-16 ENCOUNTER — Encounter: Payer: Self-pay | Admitting: General Practice

## 2024-12-16 ENCOUNTER — Ambulatory Visit: Payer: Self-pay | Admitting: General Practice

## 2024-12-16 ENCOUNTER — Ambulatory Visit: Admitting: General Practice

## 2024-12-16 VITALS — BP 112/62 | HR 72 | Temp 98.7°F | Ht 71.0 in | Wt 276.4 lb

## 2024-12-16 DIAGNOSIS — E6609 Other obesity due to excess calories: Secondary | ICD-10-CM | POA: Diagnosis not present

## 2024-12-16 DIAGNOSIS — Z1211 Encounter for screening for malignant neoplasm of colon: Secondary | ICD-10-CM

## 2024-12-16 DIAGNOSIS — Z6839 Body mass index (BMI) 39.0-39.9, adult: Secondary | ICD-10-CM | POA: Diagnosis not present

## 2024-12-16 DIAGNOSIS — E66812 Obesity, class 2: Secondary | ICD-10-CM | POA: Diagnosis not present

## 2024-12-16 DIAGNOSIS — Z23 Encounter for immunization: Secondary | ICD-10-CM | POA: Diagnosis not present

## 2024-12-16 DIAGNOSIS — Z0000 Encounter for general adult medical examination without abnormal findings: Secondary | ICD-10-CM | POA: Insufficient documentation

## 2024-12-16 DIAGNOSIS — E7800 Pure hypercholesterolemia, unspecified: Secondary | ICD-10-CM | POA: Diagnosis not present

## 2024-12-16 DIAGNOSIS — R7303 Prediabetes: Secondary | ICD-10-CM | POA: Diagnosis not present

## 2024-12-16 DIAGNOSIS — Z720 Tobacco use: Secondary | ICD-10-CM

## 2024-12-16 LAB — COMPREHENSIVE METABOLIC PANEL WITH GFR
ALT: 14 U/L (ref 3–53)
AST: 13 U/L (ref 5–37)
Albumin: 4.5 g/dL (ref 3.5–5.2)
Alkaline Phosphatase: 76 U/L (ref 39–117)
BUN: 19 mg/dL (ref 6–23)
CO2: 25 meq/L (ref 19–32)
Calcium: 10.5 mg/dL (ref 8.4–10.5)
Chloride: 104 meq/L (ref 96–112)
Creatinine, Ser: 0.68 mg/dL (ref 0.40–1.50)
GFR: 105.51 mL/min
Glucose, Bld: 90 mg/dL (ref 70–99)
Potassium: 4.3 meq/L (ref 3.5–5.1)
Sodium: 137 meq/L (ref 135–145)
Total Bilirubin: 0.8 mg/dL (ref 0.2–1.2)
Total Protein: 8 g/dL (ref 6.0–8.3)

## 2024-12-16 LAB — CBC
HCT: 51.9 % (ref 39.0–52.0)
Hemoglobin: 17 g/dL (ref 13.0–17.0)
MCHC: 32.8 g/dL (ref 30.0–36.0)
MCV: 92.9 fl (ref 78.0–100.0)
Platelets: 253 K/uL (ref 150.0–400.0)
RBC: 5.58 Mil/uL (ref 4.22–5.81)
RDW: 13.9 % (ref 11.5–15.5)
WBC: 4.9 K/uL (ref 4.0–10.5)

## 2024-12-16 LAB — LIPID PANEL
Cholesterol: 200 mg/dL (ref 28–200)
HDL: 43 mg/dL
LDL Cholesterol: 134 mg/dL — ABNORMAL HIGH (ref 10–99)
NonHDL: 156.52
Total CHOL/HDL Ratio: 5
Triglycerides: 111 mg/dL (ref 10.0–149.0)
VLDL: 22.2 mg/dL (ref 0.0–40.0)

## 2024-12-16 LAB — HEMOGLOBIN A1C: Hgb A1c MFr Bld: 5 % (ref 4.6–6.5)

## 2024-12-16 LAB — TSH: TSH: 2.49 u[IU]/mL (ref 0.35–5.50)

## 2024-12-16 NOTE — Patient Instructions (Signed)
 Stop by the lab prior to leaving today. I will notify you of your results once received.    You will either be contacted via phone regarding your referral to gastroenterology, or you may receive a letter on your MyChart portal from our referral team with instructions for scheduling an appointment. Please let us  know if you have not been contacted by anyone within two weeks.   Follow up in one year for physical and fasting labs.   It was a pleasure to see you today!

## 2024-12-16 NOTE — Assessment & Plan Note (Signed)
 Has cut back on vaping.   Discussed cessation.

## 2024-12-16 NOTE — Progress Notes (Signed)
 "  Established Patient Office Visit  Subjective   Patient ID: Ricardo Clark, male    DOB: 05-27-1971  Age: 54 y.o. MRN: [id number]  Chief Complaint  Patient presents with   Annual Exam    With fasting labs    HPI  Ricardo Clark is a 54 year old male with past medical history of insulin  resistance, tobacco abuse, prediabetes, chronic pain of left knee, obesity, elevated cholesterol level, vitamin d  deficiency presents for complete physical and follow up of chronic conditions.  Immunizations: -Tetanus: Completed in 2025 -Influenza: due -Shingles: due -Pneumonia: due  Diet: Fair diet.  Exercise: regular exercise.  Eye exam: Completes annually  Dental exam: Completes semi-annually    Colonoscopy: due     Patient Active Problem List   Diagnosis Date Noted   Encounter for screening and preventative care 12/16/2024   Vitamin D  deficiency 03/16/2024   Insulin  resistance 03/16/2024   Elevated LDL cholesterol level 01/09/2024   Obesity 12/26/2023   Unilateral primary osteoarthritis, left hip 11/20/2022   Prediabetes 09/15/2018   Preventative health care 09/09/2017   Borderline hyperlipidemia 09/09/2017   Chronic pain of left knee 04/25/2017   Tobacco abuse 11/24/2009   Past Medical History:  Diagnosis Date   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 No pertinent surgical history. Allergies[1]       12/16/2024    8:59 AM 01/09/2024    8:15 AM 12/26/2023   11:53 AM  Depression screen PHQ 2/9  Decreased Interest 0 0 0  Down, Depressed, Hopeless 0 0 0  PHQ - 2 Score 0 0 0  Altered sleeping 0 0 0  Tired, decreased energy 0 0 1  Change in appetite 0 0 0  Feeling bad or failure about yourself  0 0 0  Trouble concentrating 0 0 0  Moving slowly or fidgety/restless 0 0 0  Suicidal thoughts 0 0 0  PHQ-9 Score 0 0  1   Difficult doing work/chores Not difficult at all Not difficult at all Not difficult at all      Data saved with a previous flowsheet row definition       12/16/2024    8:59 AM 01/09/2024    8:15 AM 12/26/2023   11:53 AM  GAD 7 : Generalized Anxiety Score  Nervous, Anxious, on Edge 0 0 1  Control/stop worrying 0 0 0  Worry too much - different things 0 0 0  Trouble relaxing 0 0 0  Restless 0 0 0  Easily annoyed or irritable 0 0 0  Afraid - awful might happen 0 0 0  Total GAD 7 Score 0 0 1  Anxiety Difficulty Not difficult at all Not difficult at all Not difficult at all      Review of Systems  Constitutional:  Negative for chills, fever, malaise/fatigue and weight loss.  HENT:  Negative for congestion, ear discharge, ear pain, hearing loss, nosebleeds, sinus pain, sore throat and tinnitus.   Eyes:  Negative for blurred vision, double vision, pain, discharge and redness.  Respiratory:  Negative for cough, shortness of breath, wheezing and stridor.   Cardiovascular:  Negative for chest pain, palpitations and leg swelling.  Gastrointestinal:  Negative for abdominal pain, constipation, diarrhea, heartburn, nausea and vomiting.  Genitourinary:  Negative for dysuria, frequency and urgency.  Musculoskeletal:  Negative for myalgias.  Skin:  Negative for rash.  Neurological:  Negative for dizziness, tingling, seizures, weakness and headaches.  Psychiatric/Behavioral:  Negative for depression, substance abuse and suicidal ideas. The patient is not nervous/anxious.       Objective:     BP 112/62   Pulse 72   Temp 98.7 F (37.1 C) (Temporal)   Ht 5' 11 (1.803 m)   Wt 276 lb 6.4 oz (125.4 kg)   SpO2 95%   BMI 38.55 kg/m  BP Readings from Last 3 Encounters:  12/16/24 112/62  11/24/24 116/76  10/18/24 119/76   Wt Readings from Last 3 Encounters:  12/16/24 276 lb 6.4 oz (125.4 kg)  11/24/24 262 lb (118.8 kg)  10/18/24 263 lb (119.3 kg)      Physical Exam Vitals and nursing note reviewed.  Constitutional:      Appearance: Normal appearance.  HENT:     Head:  Normocephalic and atraumatic.     Right Ear: Tympanic membrane, ear canal and external ear normal.     Left Ear: Tympanic membrane, ear canal and external ear normal.     Nose: Nose normal.     Mouth/Throat:     Mouth: Mucous membranes are moist.     Pharynx: Oropharynx is clear.  Eyes:     Conjunctiva/sclera: Conjunctivae normal.     Pupils: Pupils are equal, round, and reactive to light.  Cardiovascular:     Rate and Rhythm: Normal rate and regular rhythm.     Pulses: Normal pulses.     Heart sounds: Normal heart sounds.  Pulmonary:     Effort: Pulmonary effort is normal.     Breath sounds: Normal breath sounds.  Abdominal:     General: Abdomen is flat. Bowel sounds are normal.     Palpations: Abdomen is soft.  Musculoskeletal:        General: Normal range of motion.     Cervical back: Normal range of motion.  Skin:    General: Skin is warm and dry.     Capillary Refill: Capillary refill takes less than 2 seconds.  Neurological:     General: No focal deficit present.     Mental Status: He is alert and oriented to person, place, and time. Mental status is at baseline.  Psychiatric:        Mood and Affect: Mood normal.        Behavior: Behavior normal.        Thought Content: Thought content normal.        Judgment: Judgment normal.      No results found for any visits on 12/16/24.     The 10-year ASCVD risk score (Arnett DK, et al., 2019) is: 4.8%    Assessment & Plan:  Encounter for screening and preventative care Assessment & Plan: Immunizations tdap is UTD; declines influenza and shingrix; prevnar 20 given today. Colonoscopy due  Discussed the importance of a healthy diet and regular exercise in order for weight loss, and to reduce the risk of further co-morbidity.  Exam stable. Labs pending.  Follow up in 1 year for repeat physical.   Orders: -     CBC -     Comprehensive metabolic panel with GFR -     Lipid panel -     TSH  Screening for colon  cancer -     Ambulatory referral to Gastroenterology  Prediabetes Assessment & Plan: A1c pending.  Orders: -     Hemoglobin A1c  Need for pneumococcal 20-valent conjugate vaccination -     Pneumococcal conjugate vaccine 20-valent  Elevated LDL cholesterol level  Assessment & Plan: Lipid panel pending.   Tobacco abuse Assessment & Plan: Has cut back on vaping.   Discussed cessation.    Class 2 obesity due to excess calories without serious comorbidity with body mass index (BMI) of 39.0 to 39.9 in adult Assessment & Plan: Improving.  Commended patient on his weight loss.   Following with healthy weight and wellness clinic.  Reviewed notes and labs.      Return in about 1 year (around 12/16/2025) for physical and fasting labs. SABRA Carrol Aurora, NP     [1] No Known Allergies  "

## 2024-12-16 NOTE — Assessment & Plan Note (Signed)
 A1c pending.

## 2024-12-16 NOTE — Assessment & Plan Note (Signed)
 Improving.  Commended patient on his weight loss.   Following with healthy weight and wellness clinic.  Reviewed notes and labs.

## 2024-12-16 NOTE — Assessment & Plan Note (Signed)
 Lipid panel pending.

## 2024-12-16 NOTE — Assessment & Plan Note (Signed)
 Immunizations tdap is UTD; declines influenza and shingrix; prevnar 20 given today. Colonoscopy due  Discussed the importance of a healthy diet and regular exercise in order for weight loss, and to reduce the risk of further co-morbidity.  Exam stable. Labs pending.  Follow up in 1 year for repeat physical.

## 2024-12-30 ENCOUNTER — Ambulatory Visit: Admitting: Bariatrics

## 2025-01-06 ENCOUNTER — Ambulatory Visit: Admitting: Bariatrics

## 2025-01-06 ENCOUNTER — Encounter: Payer: Self-pay | Admitting: Bariatrics

## 2025-01-06 VITALS — BP 130/80 | HR 66 | Ht 71.0 in | Wt 270.0 lb

## 2025-01-06 DIAGNOSIS — E669 Obesity, unspecified: Secondary | ICD-10-CM | POA: Diagnosis not present

## 2025-01-06 DIAGNOSIS — E559 Vitamin D deficiency, unspecified: Secondary | ICD-10-CM | POA: Diagnosis not present

## 2025-01-06 DIAGNOSIS — R7303 Prediabetes: Secondary | ICD-10-CM | POA: Diagnosis not present

## 2025-01-06 DIAGNOSIS — Z6837 Body mass index (BMI) 37.0-37.9, adult: Secondary | ICD-10-CM | POA: Diagnosis not present

## 2025-01-06 MED ORDER — VITAMIN D (ERGOCALCIFEROL) 1.25 MG (50000 UNIT) PO CAPS: 50000.0000 [IU] | ORAL_CAPSULE | ORAL | 0 refills | Status: AC

## 2025-01-06 NOTE — Progress Notes (Signed)
 "                                                                                                             WEIGHT SUMMARY AND BIOMETRICS  Weight Lost Since Last Visit: 0  Weight Gained Since Last Visit: 8lb   Vitals BP: 130/80 Pulse Rate: 66 SpO2: 98 %   Anthropometric Measurements Height: 5' 11 (1.803 m) Weight: 270 lb (122.5 kg) BMI (Calculated): 37.67 Weight at Last Visit: 262lb Weight Lost Since Last Visit: 0 Weight Gained Since Last Visit: 8lb Starting Weight: 304lb Total Weight Loss (lbs): 34 lb (15.4 kg) Peak Weight: 301lb   Body Composition  Body Fat %: 35.6 % Fat Mass (lbs): 96.2 lbs Muscle Mass (lbs): 165.6 lbs Total Body Water (lbs): 119.4 lbs Visceral Fat Rating : 21   Other Clinical Data Fasting: no Labs: no Today's Visit #: 12 Starting Date: 03/15/24    OBESITY Ricardo Clark is here to discuss his progress with his obesity treatment plan along with follow-up of his obesity related diagnoses.    Nutrition Plan: keeping a food journal with goal of 2200 calories and 130-150 grams of protein daily - 50% adherence.  Current exercise: cardiovascular workout on exercise equipment and muscle exercises.  Interim History:  He is up 8 lbs since his last visit, but has done well overall.  He states that his hunger has increased exponentially since he started his new workout routine.  He also states that his breakfast and lunch is are doing fine but he is having some issues with his dinner menus. Eating all of the food on the plan., Protein intake is as prescribed, Not journaling consistently., and Water intake is adequate.   Pharmacotherapy: Hunger is moderately controlled.  Cravings are moderately controlled.  Assessment/Plan:   Vitamin D  Deficiency Vitamin D  is at goal of 50.  Most recent vitamin D  level was 57.7. He is on  prescription ergocalciferol  50,000 IU weekly. Lab Results  Component Value Date   VD25OH 57.7 07/15/2024   VD25OH 19.3 (L)  03/15/2024    Plan: Refill prescription vitamin D  50,000 IU weekly.   Prediabetes Last A1c was 5.0  Medication(s): none Lab Results  Component Value Date   HGBA1C 5.0 12/16/2024   HGBA1C 5.3 12/26/2023   HGBA1C 5.1 10/20/2019   HGBA1C 5.7 (A) 09/15/2018   HGBA1C 5.1 09/05/2017   Lab Results  Component Value Date   INSULIN  15.0 07/15/2024   INSULIN  36.9 (H) 03/15/2024    Plan: Will minimize all refined carbohydrates both sweets and starches.  Will work on the plan and exercise.  Consider both aerobic and resistance training.  Will keep protein, water, and fiber intake high.  Increase Polyunsaturated and Monounsaturated fats to increase satiety and encourage weight loss.  He will begin psyllium 6 to 10 g in 8 ounces of water about 15 to 30 minutes after his workout, additionally if hungry which he can have a another protein shake.  He will gradually increase his psyllium as to not cause any stomach upset and  may start with a lower dose initially. He will go back to his turkey burger for lunch as that was giving him more satiety. We discussed lunch and dinner options.   Aim for 7 to 9 hours of sleep nightly.    Generalized Obesity: Current BMI BMI (Calculated): 37.67   Pharmacotherapy Plan No antiobesity medications.  Bishoy is currently in the action stage of change. As such, his goal is to continue with weight loss efforts.  He has agreed to keeping a food journal with goal of 2,200 calories and 130 to 150 grams of protein daily.  Exercise goals: All adults should avoid inactivity. Some physical activity is better than none, and adults who participate in any amount of physical activity gain some health benefits. Will continue his current exercise regimen.  Behavioral modification strategies: increasing lean protein intake, decreasing simple carbohydrates , no meal skipping, meal planning , increase water intake, better snacking choices, increasing vegetables, keep healthy  foods in the home, weigh protein portions, measure portion sizes, work on smaller portions, and mindful eating.  Cadell has agreed to follow-up with our clinic in 4 weeks.      Objective:   VITALS: Per patient if applicable, see vitals. GENERAL: Alert and in no acute distress. CARDIOPULMONARY: No increased WOB. Speaking in clear sentences.  PSYCH: Pleasant and cooperative. Speech normal rate and rhythm. Affect is appropriate. Insight and judgement are appropriate. Attention is focused, linear, and appropriate.  NEURO: Oriented as arrived to appointment on time with no prompting.   Attestation Statements:   This was prepared with the assistance of Engineer, Civil (consulting).  Occasional wrong-word or sound-a-like substitutions may have occurred due to the inherent limitations of voice recognition.   Ricardo Daring, DO    "

## 2025-02-07 ENCOUNTER — Ambulatory Visit: Admitting: Bariatrics

## 2025-12-19 ENCOUNTER — Encounter: Admitting: General Practice
